# Patient Record
Sex: Male | Born: 1990 | Race: White | Hispanic: No | Marital: Single | Smoking: Current every day smoker
Health system: Southern US, Community
[De-identification: ages and names within clinical notes are randomized; demographics above are authoritative.]

## PROBLEM LIST (undated history)

## (undated) DIAGNOSIS — F32A Depression, unspecified: Secondary | ICD-10-CM

## (undated) DIAGNOSIS — F329 Major depressive disorder, single episode, unspecified: Secondary | ICD-10-CM

## (undated) HISTORY — PX: WISDOM TOOTH EXTRACTION: SHX21

---

## 2012-12-01 ENCOUNTER — Encounter (HOSPITAL_COMMUNITY): Payer: Self-pay | Admitting: *Deleted

## 2012-12-01 ENCOUNTER — Emergency Department (HOSPITAL_COMMUNITY)
Admission: EM | Admit: 2012-12-01 | Discharge: 2012-12-02 | Disposition: A | Discharge status: Home / Self Care | Payer: Self-pay | Attending: Emergency Medicine | Admitting: Emergency Medicine

## 2012-12-01 DIAGNOSIS — R45851 Suicidal ideations: Secondary | ICD-10-CM | POA: Insufficient documentation

## 2012-12-01 DIAGNOSIS — F1210 Cannabis abuse, uncomplicated: Secondary | ICD-10-CM | POA: Insufficient documentation

## 2012-12-01 DIAGNOSIS — F329 Major depressive disorder, single episode, unspecified: Secondary | ICD-10-CM | POA: Insufficient documentation

## 2012-12-01 HISTORY — DX: Depression, unspecified: F32.A

## 2012-12-01 HISTORY — DX: Major depressive disorder, single episode, unspecified: F32.9

## 2012-12-01 LAB — COMPREHENSIVE METABOLIC PANEL
ALT: 16 U/L (ref 0–53)
AST: 23 U/L (ref 0–37)
Alkaline Phosphatase: 48 U/L (ref 39–117)
CO2: 25 mEq/L (ref 19–32)
Chloride: 103 mEq/L (ref 96–112)
Creatinine, Ser: 0.71 mg/dL (ref 0.50–1.35)
GFR calc non Af Amer: >90 mL/min (ref >90)
Potassium: 3.5 mEq/L (ref 3.5–5.1)
Sodium: 139 mEq/L (ref 135–145)
Total Bilirubin: 0.8 mg/dL (ref 0.3–1.2)

## 2012-12-01 LAB — CBC
MCV: 86.7 fL (ref 78.0–100.0)
Platelets: 213 10*3/uL (ref 150–400)
RBC: 5.02 MIL/uL (ref 4.22–5.81)
WBC: 7.6 10*3/uL (ref 4.0–10.5)

## 2012-12-01 LAB — ACETAMINOPHEN LEVEL: Acetaminophen (Tylenol), Serum: <15 ug/mL (ref 10–30)

## 2012-12-01 LAB — RAPID URINE DRUG SCREEN, HOSP PERFORMED
Amphetamines: POSITIVE — AB
Barbiturates: NOT DETECTED
Benzodiazepines: NOT DETECTED
Tetrahydrocannabinol: POSITIVE — AB

## 2012-12-01 NOTE — ED Notes (Signed)
Pt states that he is at his end,  He has thought about jumping off a bridge or walking in front of train,  Says he apologized to his Mom today for about what he is going to do.  Pt has flat affect and doesn't look at this Clinical research associate while interviewing

## 2012-12-02 ENCOUNTER — Encounter (HOSPITAL_COMMUNITY): Payer: Self-pay | Admitting: *Deleted

## 2012-12-02 MED ORDER — IBUPROFEN 600 MG PO TABS: 600.0000 mg | ORAL_TABLET | Freq: Three times a day (TID) | ORAL | Status: DC | PRN

## 2012-12-02 MED ORDER — NICOTINE 21 MG/24HR TD PT24: 21.0000 mg | MEDICATED_PATCH | Freq: Every day | TRANSDERMAL | Status: DC

## 2012-12-02 MED ORDER — ONDANSETRON HCL 4 MG PO TABS: 4.0000 mg | ORAL_TABLET | Freq: Three times a day (TID) | ORAL | Status: DC | PRN

## 2012-12-02 MED ORDER — ACETAMINOPHEN 325 MG PO TABS: 650.0000 mg | ORAL_TABLET | ORAL | Status: DC | PRN

## 2012-12-02 MED ORDER — LORAZEPAM 1 MG PO TABS: 1.0000 mg | ORAL_TABLET | Freq: Three times a day (TID) | ORAL | Status: DC | PRN

## 2012-12-02 NOTE — ED Provider Notes (Signed)
History     CSN: [id number]  Arrival date & time 12/01/12  2241   First MD Initiated Contact with Patient 12/02/12 0134      Chief Complaint  Patient presents with  . Medical Clearance    (Consider location/radiation/quality/duration/timing/severity/associated sxs/prior treatment) HPI Comments: Patient states he has been depressed recently over living situation disagreements with girlfriend and his work.  Tonight, expressed suicidal ideation by jumping off which he, states he has no intention of doing his never had any psychiatric evaluation or issues before.  Denies any alcohol abuse.  Does, state that he smokes marijuana on a regular basis.  Denies any medical complaints at this time  The history is provided by the patient.    Past Medical History  Diagnosis Date  . Depression     No past surgical history on file.  History reviewed. No pertinent family history.  History  Substance Use Topics  . Smoking status: Never Smoker   . Smokeless tobacco: Not on file  . Alcohol Use: Yes     Comment: occasional use       Review of Systems  Psychiatric/Behavioral: Positive for suicidal ideas.  All other systems reviewed and are negative.    Allergies  Review of patient's allergies indicates no known allergies.  Home Medications  No current outpatient prescriptions on file.  BP 116/73  Pulse 53  Temp(Src) 97.8 F (36.6 C) (Oral)  Resp 18  SpO2 99%  Physical Exam  Nursing note and vitals reviewed. Constitutional: He is oriented to person, place, and time. He appears well-developed and well-nourished.  HENT:  Head: Normocephalic.  Eyes: Pupils are equal, round, and reactive to light.  Neck: Normal range of motion.  Cardiovascular: Normal rate.   Pulmonary/Chest: Effort normal.  Neurological: He is alert and oriented to person, place, and time.  Skin: Skin is warm and dry.  Psychiatric: His speech is normal and behavior is normal. Judgment and thought content  normal. Cognition and memory are normal. He exhibits a depressed mood.    ED Course  Procedures (including critical care time)  Labs Reviewed  SALICYLATE LEVEL - Abnormal; Notable for the following:    Salicylate Lvl <2.0 (*)    All other components within normal limits  URINE RAPID DRUG SCREEN (HOSP PERFORMED) - Abnormal; Notable for the following:    Amphetamines POSITIVE (*)    Tetrahydrocannabinol POSITIVE (*)    All other components within normal limits  ACETAMINOPHEN LEVEL  CBC  COMPREHENSIVE METABOLIC PANEL  ETHANOL   No results found.   1. Major depressive episode       MDM   DR. Penalver , formed, tell psych consult, and recommends discharge home with mental health followup Patient contracted for safety        Arman Filter, NP 12/02/12 4098  Arman Filter, NP 12/02/12 [phone]

## 2012-12-02 NOTE — BH Assessment (Signed)
Assessment Note   Darius Wilson is a 22 y.o. male who presents to [facility] with SI/Depression.  Pt is Si w/plan to jump off bridge or walk in front of train.  Pt denies HI/Psych.  Pt reports stressors: (1) new job, (2) moving away from parents home, (3) musician(trouble with making a living), (4) relational issues with parents and girlfriend.  Pt says he feels stagnate, not moving forward in life.  Pt admits to SI thoughts in the past, no attempts to harm.  Pt has been SI x2 days. Pt says he's overwhelmed and trouble coping and states that he feels better since being int he emerg dept and contract for safety.  Pt says he smoke 1/2 gram of thc daily, last use 12/01/12, uses alcohol occasionally.  Pt completed telepsych, recommend d/c.  Dr. Fonnie Jarvis aware of disposition, will d/c pt home.   Axis I: Depressive D/O; Cannabis Abuse  Axis II: Deferred Axis III:  Past Medical History  Diagnosis Date  . Depression    Axis IV: other psychosocial or environmental problems, problems related to social environment and problems with primary support group Axis V: 41-50 serious symptoms  Past Medical History:  Past Medical History  Diagnosis Date  . Depression     No past surgical history on file.  Family History: History reviewed. No pertinent family history.  Social History:  reports that he has never smoked. He does not have any smokeless tobacco history on file. He reports that  drinks alcohol. He reports that he uses illicit drugs (Marijuana).  Additional Social History:  Alcohol / Drug Use Pain Medications: None  Prescriptions: None  Over the Counter: None  History of alcohol / drug use?: Yes Longest period of sobriety (when/how long): None  Negative Consequences of Use: Personal relationships Withdrawal Symptoms: Other (Comment) (No current w/d sxs ) Substance #1 Name of Substance 1: THC 1 - Age of First Use: Teens  1 - Amount (size/oz): 1/2 Gram  1 - Frequency: Daily  1 - Duration:  On-going  1 - Last Use / Amount: 12/01/12  CIWA: CIWA-Ar BP: 125/82 mmHg Pulse Rate: 66 COWS:    Allergies: No Known Allergies  Home Medications:  (Not in a hospital admission)  OB/GYN Status:  No LMP for male patient.  General Assessment Data Location of Assessment: [facility] Living Arrangements: Parent (Lives with mother ) Can pt return to current living arrangement?: Yes Admission Status: Voluntary Is patient capable of signing voluntary admission?: Yes Transfer from: Acute Hospital Referral Source: MD  Education Status Is patient currently in school?: No Current Grade: None  Highest grade of school patient has completed: None  Name of school: None  Contact person: None   Risk to self Suicidal Ideation: No-Not Currently/Within Last 6 Months Suicidal Intent: No-Not Currently/Within Last 6 Months Is patient at risk for suicide?: No Suicidal Plan?: No-Not Currently/Within Last 6 Months Access to Means: No What has been your use of drugs/alcohol within the last 12 months?: Abusing THC--1/2 gram daily  Previous Attempts/Gestures: No How many times?: 0 Other Self Harm Risks: None  Triggers for Past Attempts: Unpredictable Intentional Self Injurious Behavior: None Family Suicide History: No Recent stressful life event(s): Conflict (Comment) (Issues with girlfriend) Persecutory voices/beliefs?: No Depression: Yes Depression Symptoms: Loss of interest in usual pleasures Substance abuse history and/or treatment for substance abuse?: Yes Suicide prevention information given to non-admitted patients: Not applicable  Risk to Others Homicidal Ideation: No Thoughts of Harm to Others: No Current Homicidal Intent:  No Current Homicidal Plan: No Access to Homicidal Means: No Identified Victim: None  History of harm to others?: No Assessment of Violence: None Noted Violent Behavior Description: None  Does patient have access to weapons?: No Criminal Charges Pending?: No Does  patient have a court date: No  Psychosis Hallucinations: None noted Delusions: None noted  Mental Status Report Appear/Hygiene: Disheveled;Poor hygiene Eye Contact: Good Motor Activity: Unremarkable Speech: Logical/coherent Level of Consciousness: Alert Mood: Ashamed/humiliated;Sad Affect: Appropriate to circumstance;Sad Anxiety Level: None Thought Processes: Coherent;Relevant Judgement: Impaired Orientation: Person;Place;Time;Situation Obsessive Compulsive Thoughts/Behaviors: None  Cognitive Functioning Concentration: Normal Memory: Recent Intact;Remote Intact IQ: Average Insight: Fair Impulse Control: Fair Appetite: Good Weight Loss: 0 Weight Gain: 0 Sleep: No Change Total Hours of Sleep: 8 Vegetative Symptoms: None  ADLScreening [facility] Assessment Services) Patient's cognitive ability adequate to safely complete daily activities?: Yes Patient able to express need for assistance with ADLs?: Yes Independently performs ADLs?: Yes (appropriate for developmental age)  Abuse/Neglect [facility]) Physical Abuse: Denies Verbal Abuse: Denies Sexual Abuse: Denies  Prior Inpatient Therapy Prior Inpatient Therapy: No Prior Therapy Dates: None  Prior Therapy Facilty/Provider(s): None  Reason for Treatment: Noen   Prior Outpatient Therapy Prior Outpatient Therapy: No Prior Therapy Dates: None  Prior Therapy Facilty/Provider(s): None  Reason for Treatment: None   ADL Screening (condition at time of admission) Patient's cognitive ability adequate to safely complete daily activities?: Yes Patient able to express need for assistance with ADLs?: Yes Independently performs ADLs?: Yes (appropriate for developmental age) Weakness of Legs: None Weakness of Arms/Hands: None  Home Assistive Devices/Equipment Home Assistive Devices/Equipment: None  Therapy Consults (therapy consults require a physician order) PT Evaluation Needed: No OT Evalulation Needed: No SLP Evaluation Needed:  No Abuse/Neglect Assessment (Assessment to be complete while patient is alone) Physical Abuse: Denies Verbal Abuse: Denies Sexual Abuse: Denies Exploitation of patient/patient's resources: Denies Self-Neglect: Denies Values / Beliefs Cultural Requests During Hospitalization: None Spiritual Requests During Hospitalization: None Consults Spiritual Care Consult Needed: No Social Work Consult Needed: No Merchant navy officer (For Healthcare) Advance Directive: Patient does not have advance directive;Patient would not like information Pre-existing out of facility DNR order (yellow form or pink MOST form): No Nutrition Screen- [facility] Patient's home diet: Regular Have you recently lost weight without trying?: No Have you been eating poorly because of a decreased appetite?: No Malnutrition Screening Tool Score: 0  Additional Information 1:1 In Past 12 Months?: No CIRT Risk: No Elopement Risk: No Does patient have medical clearance?: Yes     Disposition:  Disposition Initial Assessment Completed for this Encounter: Yes Disposition of Patient: Referred to (Telepsych ) Patient referred to: Other (Comment) (Telepsych )  On Site Evaluation by:   Reviewed with Physician:     Murrell Redden 12/02/2012 5:41 AM

## 2012-12-02 NOTE — ED Notes (Signed)
Patient arrived to unit. No s/s of distress noted. Pt pleasant and verbally contracts for safety.

## 2012-12-08 NOTE — ED Provider Notes (Signed)
Medical screening examination/treatment/procedure(s) were performed by non-physician practitioner and as supervising physician I was immediately available for consultation/collaboration.  Hurman Horn, MD 12/08/12 [phone]

## 2014-02-08 ENCOUNTER — Inpatient Hospital Stay (HOSPITAL_COMMUNITY): Payer: Medicaid Other

## 2014-02-08 ENCOUNTER — Inpatient Hospital Stay (HOSPITAL_COMMUNITY)
Admission: EM | Admit: 2014-02-08 | Discharge: 2014-02-11 | DRG: 918 | Disposition: A | Discharge status: Other Acute Inpatient Hospital | Payer: Self-pay | Attending: Internal Medicine | Admitting: Internal Medicine

## 2014-02-08 ENCOUNTER — Encounter (HOSPITAL_COMMUNITY): Payer: Self-pay | Admitting: Emergency Medicine

## 2014-02-08 DIAGNOSIS — F32A Depression, unspecified: Secondary | ICD-10-CM | POA: Diagnosis present

## 2014-02-08 DIAGNOSIS — F329 Major depressive disorder, single episode, unspecified: Secondary | ICD-10-CM

## 2014-02-08 DIAGNOSIS — T50904A Poisoning by unspecified drugs, medicaments and biological substances, undetermined, initial encounter: Secondary | ICD-10-CM

## 2014-02-08 DIAGNOSIS — T50901A Poisoning by unspecified drugs, medicaments and biological substances, accidental (unintentional), initial encounter: Principal | ICD-10-CM | POA: Diagnosis present

## 2014-02-08 DIAGNOSIS — T6591XA Toxic effect of unspecified substance, accidental (unintentional), initial encounter: Secondary | ICD-10-CM | POA: Diagnosis present

## 2014-02-08 DIAGNOSIS — F322 Major depressive disorder, single episode, severe without psychotic features: Secondary | ICD-10-CM

## 2014-02-08 DIAGNOSIS — E876 Hypokalemia: Secondary | ICD-10-CM | POA: Diagnosis present

## 2014-02-08 DIAGNOSIS — F19921 Other psychoactive substance use, unspecified with intoxication with delirium: Secondary | ICD-10-CM | POA: Diagnosis present

## 2014-02-08 DIAGNOSIS — F411 Generalized anxiety disorder: Secondary | ICD-10-CM | POA: Diagnosis present

## 2014-02-08 DIAGNOSIS — F332 Major depressive disorder, recurrent severe without psychotic features: Secondary | ICD-10-CM | POA: Diagnosis present

## 2014-02-08 DIAGNOSIS — T50902A Poisoning by unspecified drugs, medicaments and biological substances, intentional self-harm, initial encounter: Secondary | ICD-10-CM | POA: Diagnosis present

## 2014-02-08 DIAGNOSIS — Z87891 Personal history of nicotine dependence: Secondary | ICD-10-CM

## 2014-02-08 DIAGNOSIS — F05 Delirium due to known physiological condition: Secondary | ICD-10-CM

## 2014-02-08 DIAGNOSIS — R9431 Abnormal electrocardiogram [ECG] [EKG]: Secondary | ICD-10-CM | POA: Diagnosis present

## 2014-02-08 DIAGNOSIS — R45851 Suicidal ideations: Secondary | ICD-10-CM

## 2014-02-08 DIAGNOSIS — F1210 Cannabis abuse, uncomplicated: Secondary | ICD-10-CM | POA: Diagnosis present

## 2014-02-08 DIAGNOSIS — F3289 Other specified depressive episodes: Secondary | ICD-10-CM

## 2014-02-08 DIAGNOSIS — IMO0002 Reserved for concepts with insufficient information to code with codable children: Secondary | ICD-10-CM

## 2014-02-08 LAB — CBC WITH DIFFERENTIAL/PLATELET
BASOS PCT: 0 % (ref 0–1)
Basophils Absolute: 0 10*3/uL (ref 0.0–0.1)
EOS ABS: 0 10*3/uL (ref 0.0–0.7)
EOS PCT: 0 % (ref 0–5)
HEMATOCRIT: 43.7 % (ref 39.0–52.0)
HEMOGLOBIN: 15.4 g/dL (ref 13.0–17.0)
Lymphocytes Relative: 13 % (ref 12–46)
Lymphs Abs: 1.4 10*3/uL (ref 0.7–4.0)
MCH: 31 pg (ref 26.0–34.0)
MCHC: 35.2 g/dL (ref 30.0–36.0)
MCV: 87.9 fL (ref 78.0–100.0)
MONO ABS: 0.6 10*3/uL (ref 0.1–1.0)
MONOS PCT: 6 % (ref 3–12)
NEUTROS ABS: 8.5 10*3/uL — AB (ref 1.7–7.7)
Neutrophils Relative %: 81 % — ABNORMAL HIGH (ref 43–77)
Platelets: 262 10*3/uL (ref 150–400)
RBC: 4.97 MIL/uL (ref 4.22–5.81)
RDW: 12.7 % (ref 11.5–15.5)
WBC: 10.5 10*3/uL (ref 4.0–10.5)

## 2014-02-08 LAB — COMPREHENSIVE METABOLIC PANEL
ALT: 10 U/L (ref 0–53)
AST: 19 U/L (ref 0–37)
Albumin: 4.7 g/dL (ref 3.5–5.2)
Alkaline Phosphatase: 60 U/L (ref 39–117)
BILIRUBIN TOTAL: 0.5 mg/dL (ref 0.3–1.2)
BUN: 10 mg/dL (ref 6–23)
CHLORIDE: 99 meq/L (ref 96–112)
CO2: 22 meq/L (ref 19–32)
CREATININE: 0.93 mg/dL (ref 0.50–1.35)
Calcium: 9.7 mg/dL (ref 8.4–10.5)
GFR calc Af Amer: >90 mL/min (ref >90)
Glucose, Bld: 141 mg/dL — ABNORMAL HIGH (ref 70–99)
Potassium: 3.2 mEq/L — ABNORMAL LOW (ref 3.7–5.3)
Sodium: 140 mEq/L (ref 137–147)
Total Protein: 8.4 g/dL — ABNORMAL HIGH (ref 6.0–8.3)

## 2014-02-08 LAB — RAPID URINE DRUG SCREEN, HOSP PERFORMED
AMPHETAMINES: POSITIVE — AB
Barbiturates: NOT DETECTED
Benzodiazepines: NOT DETECTED
Cocaine: NOT DETECTED
Opiates: NOT DETECTED
TETRAHYDROCANNABINOL: POSITIVE — AB

## 2014-02-08 LAB — SALICYLATE LEVEL: Salicylate Lvl: <2 mg/dL — ABNORMAL LOW (ref 2.8–20.0)

## 2014-02-08 LAB — I-STAT CG4 LACTIC ACID, ED: LACTIC ACID, VENOUS: 2.34 mmol/L — AB (ref 0.5–2.2)

## 2014-02-08 LAB — ACETAMINOPHEN LEVEL

## 2014-02-08 LAB — ETHANOL

## 2014-02-08 LAB — MAGNESIUM: Magnesium: 2 mg/dL (ref 1.5–2.5)

## 2014-02-08 LAB — CK: Total CK: 194 U/L (ref 7–232)

## 2014-02-08 LAB — I-STAT TROPONIN, ED: Troponin i, poc: 0 ng/mL (ref 0.00–0.08)

## 2014-02-08 LAB — MRSA PCR SCREENING: MRSA BY PCR: NEGATIVE

## 2014-02-08 MED ORDER — QUETIAPINE FUMARATE 50 MG PO TABS
50.0000 mg | ORAL_TABLET | Freq: Two times a day (BID) | ORAL | Status: DC | PRN
  Filled 2014-02-08: qty 1

## 2014-02-08 MED ORDER — ONDANSETRON HCL 4 MG/2ML IJ SOLN: 4.0000 mg | Freq: Four times a day (QID) | INTRAMUSCULAR | Status: DC | PRN

## 2014-02-08 MED ORDER — LORAZEPAM 2 MG/ML IJ SOLN
1.0000 mg | Freq: Once | INTRAMUSCULAR | Status: AC
  Administered 2014-02-08: 1 mg via INTRAVENOUS
  Filled 2014-02-08: qty 1

## 2014-02-08 MED ORDER — ENOXAPARIN SODIUM 40 MG/0.4ML ~~LOC~~ SOLN
40.0000 mg | SUBCUTANEOUS | Status: DC
  Administered 2014-02-08 – 2014-02-10 (×3): 40 mg via SUBCUTANEOUS
  Filled 2014-02-08 (×5): qty 0.4

## 2014-02-08 MED ORDER — SODIUM CHLORIDE 0.9 % IJ SOLN
3.0000 mL | Freq: Two times a day (BID) | INTRAMUSCULAR | Status: DC
  Administered 2014-02-09 – 2014-02-10 (×4): 3 mL via INTRAVENOUS

## 2014-02-08 MED ORDER — ONDANSETRON HCL 4 MG PO TABS: 4.0000 mg | ORAL_TABLET | Freq: Four times a day (QID) | ORAL | Status: DC | PRN

## 2014-02-08 MED ORDER — SODIUM CHLORIDE 0.9 % IV SOLN
INTRAVENOUS | Status: DC
  Administered 2014-02-08 – 2014-02-09 (×3): via INTRAVENOUS
  Filled 2014-02-08 (×6): qty 1000

## 2014-02-08 MED ORDER — ACETAMINOPHEN 325 MG PO TABS: 650.0000 mg | ORAL_TABLET | Freq: Four times a day (QID) | ORAL | Status: DC | PRN

## 2014-02-08 MED ORDER — SODIUM CHLORIDE 0.9 % IV BOLUS (SEPSIS)
1000.0000 mL | Freq: Once | INTRAVENOUS | Status: AC
  Administered 2014-02-08: 1000 mL via INTRAVENOUS

## 2014-02-08 MED ORDER — LORAZEPAM 2 MG/ML IJ SOLN: 2.0000 mg | INTRAMUSCULAR | Status: DC | PRN

## 2014-02-08 MED ORDER — ACETAMINOPHEN 650 MG RE SUPP: 650.0000 mg | Freq: Four times a day (QID) | RECTAL | Status: DC | PRN

## 2014-02-08 NOTE — ED Notes (Signed)
CG4 Lac Acid given to Dr. Norlene Campbelltter.

## 2014-02-08 NOTE — ED Provider Notes (Signed)
Medical screening examination/treatment/procedure(s) were conducted as a shared visit with non-physician practitioner(s) and myself.  I personally evaluated the patient during the encounter.   EKG Interpretation   Date/Time:  Monday February 08 2014 03:29:03 EDT Ventricular Rate:  128 PR Interval:  124 QRS Duration: 102 QT Interval:  444 QTC Calculation: 648 R Axis:   101 Text Interpretation:  Sinus tachycardia Borderline right axis deviation ST  elev, probable normal early repol pattern Prolonged QT interval No old  tracing to compare Confirmed by Tsuruko Murtha  MD, Clayten Allcock ([id number]) on 02/08/2014  3:32:27 AM     Pt with overdose, suspect anticholinergic syndrome.  To be admitted.  Olivia Mackielga M Jamine Highfill, MD 02/08/14 [phone]

## 2014-02-08 NOTE — ED Notes (Signed)
Pt was last seen acting normal 6/14 midday.  Pt presents w/ diaphoresis, tachycardia, dilated pupils, tremors and slurred speech.  Pt denies taking any type of drugs.

## 2014-02-08 NOTE — Progress Notes (Signed)
Clinical Social Work  MD and RN requesting IVC due to patient trying to leave. CSW faxed IVC paperwork to Magistrate who confirms IVC forms were received and they will serve patient. CSW put original paperwork in chart and explained to RN that IVC forms were valid for 24 hours since MD had signed and once [employer] serves then paperwork is valid for 7 days.  GodfreyHolly Ahren Wilson, [place] [phone]

## 2014-02-08 NOTE — ED Notes (Signed)
Patient friend arrived with sleeping pill bottle patient is suspected of taking. Friend states this was a new bottle of 60 softgel [employer] (Maximum Strength Nighttime Sleep Aid) with 50mg  diphenhydramine per softgel. Updated poison control, no new recommendations at this time. Admitting MD aware of medication, dosage, and quantity suspected of ingestion.

## 2014-02-08 NOTE — ED Notes (Signed)
Per Poison Control recommendations consider other possible drugs on board. Based on patient activity and vital signs it is believed patient likely took sleep aids as suspected @1  hour to slightly greater than one hour. Recommend use of benzodiazepines to treat symptoms, and patient will likely need admission. Monitor for hyperthermia and seizure activity.

## 2014-02-08 NOTE — Progress Notes (Signed)
Patient experiencing hallucinations - speaking to people not in the room. Jumped out of bed and screamed for his mom. Patient placed back in bed and is now sobbing.

## 2014-02-08 NOTE — Consult Note (Signed)
[place] [facility] Consult   Reason for Consult:  Drug overdose (Benadryl and amphitamine) and cannabis abuse Referring Physician:  Dr. Denzil Magnuson Darius Wilson is an 23 y.o. male. Total Time spent with patient: 45 minutes  Assessment: AXIS I: Delirium secondary to drug overdose; Generalized Anxiety Disorder, Major Depression, Recurrent severe and Cannabis abuse AXIS II:  Deferred AXIS III:   Past Medical History  Diagnosis Date  . Depression    AXIS IV:  other psychosocial or environmental problems, problems related to social environment and problems with primary support group AXIS V:  21-30 behavior considerably influenced by delusions or hallucinations OR serious impairment in judgment, communication OR inability to function in almost all areas  Plan:  Patient will be followed by psychiatric consultation while he is [facility] Recommend Seroquel 50 mg BID/PRN for agitation and aggression  Recommend psychiatric Inpatient admission when medically cleared. Supportive therapy provided about ongoing stressors. Discussed crisis plan, support from social network, calling 911, coming to the Emergency Department, and calling Suicide Hotline. Appreciate psychiatric consultation and follow up tomorrow Please contact [phone] if needs further assistance  Subjective:   Darius Wilson is a 23 y.o. male patient admitted with Drug overdose.  HPI:  Patient is seen and chart reviewed, case discussed staff and staff RN and page LCSW. Patient has been restless, anxious, not coherent, disorganized and poor historian at this time. Patient seems to be delirious and trying to walk out of bed and trying to remove his BP monitor and needs repeated redirections. Reportedly he has overdose adderall unknown amount and found benadryl bottle which was open. This is a 23 year old male with past medical hx of depression and suicidal ideations was brought in by his roommate the patient was incoherent and  flushed on arrival. Patient has no family members at bedside. Patient had a missed his brother's graduation and had an argument with his mother yesterday. He also was under a lot of emotional stress after breaking up with his girlfriend a few months back. Patient's friend found a bottle of wel-sum ( benadryl) which was opened at the bedside. On counting the pills in the ED patient likely had taken 32 tablets oral 50 mg Benadryl. Patient UDS is positive for amphitamines and cannabis and BAL is less than 11.   Medical history: difficult to oral pain from the patient as he is restless and incoherent with pressured speech. headache, dizziness, fever, chills, nausea , vomiting, chest pain, palpitations, SOB, abdominal pain, bowel or urinary symptoms. He denies taking any medications or overdosing himself . Denies suicidal ideation. Course in the ED Patient was tachycardic and diaphoretic with dilated pupils. His speech was pressured and incoherent. He was afebrile. Blood we'll go in for WBC of 10.5, normal hemoglobin and platelets. Chemistry showed sodium of 140, potassium of 2.2, chloride of 99, CO2 of 22 with anion gap of 19. Renal function was normal. The glucose was 141. Lactic acid was elevated to 2.4. EKG showed normal sinus rhythm with QTC of 484. On arrival the QTC on the monitor was prolonged to greater than 600. Patient given 2 L IV normal saline bolus and 1 mg IV Ativan X2.    Review of Systems:  As outlined in history of present illness  HPI Elements:   Location:  depression and suicidal attempt. Quality:  poor. Severity:  acute. Timing:  unknown stress.  Past Psychiatric History: Past Medical History  Diagnosis Date  . Depression     reports that he has quit  smoking. He does not have any smokeless tobacco history on file. He reports that he drinks alcohol. He reports that he uses illicit drugs (Marijuana). History reviewed. No pertinent family history.      Allergies:  No Known  Allergies  ACT Assessment Complete:  NO Objective: Blood pressure 128/72, pulse 84, temperature 97 F (36.1 C), temperature source Axillary, resp. rate 15, height 5' 10"  (1.778 m), weight 69.1 kg (152 lb 5.4 oz), SpO2 97.00%.Body mass index is 21.86 kg/(m^2). Results for orders placed during the hospital encounter of 02/08/14 (from the past 72 hour(s))  CBC WITH DIFFERENTIAL     Status: Abnormal   Collection Time    02/08/14  3:45 AM      Result Value Ref Range   WBC 10.5  4.0 - 10.5 K/uL   RBC 4.97  4.22 - 5.81 MIL/uL   Hemoglobin 15.4  13.0 - 17.0 g/dL   HCT 43.7  39.0 - 52.0 %   MCV 87.9  78.0 - 100.0 fL   MCH 31.0  26.0 - 34.0 pg   MCHC 35.2  30.0 - 36.0 g/dL   RDW 12.7  11.5 - 15.5 %   Platelets 262  150 - 400 K/uL   Neutrophils Relative % 81 (*) 43 - 77 %   Neutro Abs 8.5 (*) 1.7 - 7.7 K/uL   Lymphocytes Relative 13  12 - 46 %   Lymphs Abs 1.4  0.7 - 4.0 K/uL   Monocytes Relative 6  3 - 12 %   Monocytes Absolute 0.6  0.1 - 1.0 K/uL   Eosinophils Relative 0  0 - 5 %   Eosinophils Absolute 0.0  0.0 - 0.7 K/uL   Basophils Relative 0  0 - 1 %   Basophils Absolute 0.0  0.0 - 0.1 K/uL  COMPREHENSIVE METABOLIC PANEL     Status: Abnormal   Collection Time    02/08/14  3:45 AM      Result Value Ref Range   Sodium 140  137 - 147 mEq/L   Potassium 3.2 (*) 3.7 - 5.3 mEq/L   Chloride 99  96 - 112 mEq/L   CO2 22  19 - 32 mEq/L   Glucose, Bld 141 (*) 70 - 99 mg/dL   BUN 10  6 - 23 mg/dL   Creatinine, Ser 0.93  0.50 - 1.35 mg/dL   Calcium 9.7  8.4 - 10.5 mg/dL   Total Protein 8.4 (*) 6.0 - 8.3 g/dL   Albumin 4.7  3.5 - 5.2 g/dL   AST 19  0 - 37 U/L   ALT 10  0 - 53 U/L   Alkaline Phosphatase 60  39 - 117 U/L   Total Bilirubin 0.5  0.3 - 1.2 mg/dL   GFR calc non Af Amer >90  >90 mL/min   GFR calc Af Amer >90  >90 mL/min   Comment: (NOTE)     The eGFR has been calculated using the CKD EPI equation.     This calculation has not been validated in all clinical situations.      eGFR's persistently <90 mL/min signify possible Chronic Kidney     Disease.  ETHANOL     Status: None   Collection Time    02/08/14  3:45 AM      Result Value Ref Range   Alcohol, Ethyl (B) <11  0 - 11 mg/dL   Comment:            LOWEST DETECTABLE LIMIT FOR  SERUM ALCOHOL IS 11 mg/dL     FOR MEDICAL PURPOSES ONLY  ACETAMINOPHEN LEVEL     Status: None   Collection Time    02/08/14  3:45 AM      Result Value Ref Range   Acetaminophen (Tylenol), Serum <15.0  10 - 30 ug/mL   Comment:            THERAPEUTIC CONCENTRATIONS VARY     SIGNIFICANTLY. A RANGE OF 10-30     ug/mL MAY BE AN EFFECTIVE     CONCENTRATION FOR MANY PATIENTS.     HOWEVER, SOME ARE BEST TREATED     AT CONCENTRATIONS OUTSIDE THIS     RANGE.     ACETAMINOPHEN CONCENTRATIONS     >150 ug/mL AT 4 HOURS AFTER     INGESTION AND >50 ug/mL AT 12     HOURS AFTER INGESTION ARE     OFTEN ASSOCIATED WITH TOXIC     REACTIONS.  SALICYLATE LEVEL     Status: Abnormal   Collection Time    02/08/14  3:45 AM      Result Value Ref Range   Salicylate Lvl <2.5 (*) 2.8 - 20.0 mg/dL  MAGNESIUM     Status: None   Collection Time    02/08/14  3:45 AM      Result Value Ref Range   Magnesium 2.0  1.5 - 2.5 mg/dL  CK     Status: None   Collection Time    02/08/14  3:45 AM      Result Value Ref Range   Total CK 194  7 - 232 U/L  I-STAT TROPOININ, ED     Status: None   Collection Time    02/08/14  3:56 AM      Result Value Ref Range   Troponin i, poc 0.00  0.00 - 0.08 ng/mL   Comment 3            Comment: Due to the release kinetics of cTnI,     a negative result within the first hours     of the onset of symptoms does not rule out     myocardial infarction with certainty.     If myocardial infarction is still suspected,     repeat the test at appropriate intervals.  I-STAT CG4 LACTIC ACID, ED     Status: Abnormal   Collection Time    02/08/14  3:57 AM      Result Value Ref Range   Lactic Acid, Venous 2.34 (*) 0.5 - 2.2  mmol/L  URINE RAPID DRUG SCREEN (HOSP PERFORMED)     Status: Abnormal   Collection Time    02/08/14  5:11 AM      Result Value Ref Range   Opiates NONE DETECTED  NONE DETECTED   Cocaine NONE DETECTED  NONE DETECTED   Benzodiazepines NONE DETECTED  NONE DETECTED   Amphetamines POSITIVE (*) NONE DETECTED   Tetrahydrocannabinol POSITIVE (*) NONE DETECTED   Barbiturates NONE DETECTED  NONE DETECTED   Comment:            DRUG SCREEN FOR MEDICAL PURPOSES     ONLY.  IF CONFIRMATION IS NEEDED     FOR ANY PURPOSE, NOTIFY LAB     WITHIN 5 DAYS.                LOWEST DETECTABLE LIMITS     FOR URINE DRUG SCREEN     Drug Class  Cutoff (ng/mL)     Amphetamine      1000     Barbiturate      200     Benzodiazepine   144     Tricyclics       818     Opiates          300     Cocaine          300     THC              50  MRSA PCR SCREENING     Status: None   Collection Time    02/08/14  7:04 AM      Result Value Ref Range   MRSA by PCR NEGATIVE  NEGATIVE   Comment:            The GeneXpert MRSA Assay (FDA     approved for NASAL specimens     only), is one component of a     comprehensive MRSA colonization     surveillance program. It is not     intended to diagnose MRSA     infection nor to guide or     monitor treatment for     MRSA infections.   Labs are reviewed and are pertinent for amphetamine and THC.  Current Facility-Administered Medications  Medication Dose Route Frequency Provider Last Rate Last Dose  . acetaminophen (TYLENOL) tablet 650 mg  650 mg Oral Q6H PRN Nishant Dhungel, MD       Or  . acetaminophen (TYLENOL) suppository 650 mg  650 mg Rectal Q6H PRN Nishant Dhungel, MD      . enoxaparin (LOVENOX) injection 40 mg  40 mg Subcutaneous Q24H Nishant Dhungel, MD   40 mg at 02/08/14 0801  . LORazepam (ATIVAN) injection 2 mg  2 mg Intravenous Q4H PRN Nishant Dhungel, MD      . ondansetron (ZOFRAN) tablet 4 mg  4 mg Oral Q6H PRN Nishant Dhungel, MD       Or  .  ondansetron (ZOFRAN) injection 4 mg  4 mg Intravenous Q6H PRN Nishant Dhungel, MD      . sodium chloride 0.9 % 1,000 mL with potassium chloride 40 mEq infusion   Intravenous Continuous Nishant Dhungel, MD 100 mL/hr at 02/08/14 0800    . sodium chloride 0.9 % injection 3 mL  3 mL Intravenous Q12H Nishant Dhungel, MD        Psychiatric Specialty Exam: Physical Exam  ROS  Blood pressure 128/72, pulse 84, temperature 97 F (36.1 C), temperature source Axillary, resp. rate 15, height 5' 10"  (1.778 m), weight 69.1 kg (152 lb 5.4 oz), SpO2 97.00%.Body mass index is 21.86 kg/(m^2).  General Appearance: Disheveled and Guarded  Eye Sport and exercise psychologist::  Fair  Speech:  Blocked, Garbled and Slurred  Volume:  Decreased  Mood:  Anxious, Depressed and Hopeless  Affect:  Non-Congruent and Inappropriate  Thought Process:  Disorganized, Loose and Tangential  Orientation:  Negative  Thought Content:  Rumination  Suicidal Thoughts:  Yes.  with intent/plan  Homicidal Thoughts:  No  Memory:  Remote;   Poor  Judgement:  Poor  Insight:  Shallow  Psychomotor Activity:  Restlessness  Concentration:  Poor  Recall:  Poor  Fund of Knowledge:NA  Language: Fair  Akathisia:  NA  Handed:  Right  AIMS (if indicated):     Assets:  Communication Skills Desire for Improvement Financial Resources/Insurance Resilience Social Support Talents/Skills  Sleep:  Musculoskeletal: Strength & Muscle Tone: increased Gait & Station: unable to stand Patient leans: N/A  Treatment Plan Summary: Daily contact with patient to assess and evaluate symptoms and progress in treatment Medication management  Alejandra Hunt,JANARDHAHA R. 02/08/2014 9:36 AM

## 2014-02-08 NOTE — H&P (Addendum)
[facility] History and Physical  Jacklynn GanongBrandon Carte NWG:[id number]:[id number] DOB: 23-Dec-1990 DOA: 02/08/2014  Referring physician: Dr Norlene Campbellotter PCP: No primary provider on file.   Chief Complaint:  Ingestion of unknown substance/ medications  HPI:  23 year old male with past medical hx of depression and suicidal ideations was brought in by his roommate the patient was incoherent and appeared flushed yesterday afternoon. His friend (Jackson) at bedside who provided the history  informed that patient was last seen on normal around mid-day yesterday. Patient had a missed his brother's graduation and had an argument with his mother yesterday. He also was under a lot of emotional stress after breaking up with his girlfriend a few months back. Patient's friend is unsure as to what he ingested as patient was unattended during the day. He found a bottle of wel-sum ( benadryl) which was opened at the bedside. On counting the pills in the ED patient likely had taken 32 tablets oral 50 mg Benadryl. History difficult to oral pain from the patient as he is restless and incoherent with pressured speech. headache, dizziness, fever, chills, nausea , vomiting, chest pain, palpitations, SOB, abdominal pain, bowel or urinary symptoms. He denies taking any medications or overdosing himself . Denies suicidal ideation.  Course in the ED Patient was tachycardic and diaphoretic  with dilated pupils. His speech was pressured and incoherent. He was afebrile. Blood we'll go in for WBC of 10.5, normal hemoglobin and platelets. Chemistry showed sodium of 140, potassium of 2.2, chloride of 99, CO2 of 22 with anion gap of 19. Renal function was normal. The glucose was 141. Lactic acid was elevated to 2.4. EKG showed normal sinus rhythm with QTC of 484. On arrival the QTC on the monitor was prolonged to greater than 600. Patient given 2 L IV normal saline bolus and 1 mg IV Ativan X2. [facility] called for admission to  stepdown.  Review of Systems:  As outlined in history of present illness    Past Medical History  Diagnosis Date  . Depression    History reviewed. No pertinent past surgical history. Social History:  reports that he has quit smoking. He does not have any smokeless tobacco history on file. He reports that he drinks alcohol. He reports that he uses illicit drugs (Marijuana).  No Known Allergies  History reviewed. No pertinent family history.  Prior to Admission medications   Not on File     Physical Exam:  Filed Vitals:   02/08/14 0430 02/08/14 0456 02/08/14 0500 02/08/14 0530  BP: 136/68 126/80 127/82 134/88  Pulse:  111 101 90  Temp:  98.9 F (37.2 C)  98.3 F (36.8 C)  TempSrc:  Oral  Oral  Resp: 19  25 19   SpO2:  100% 100% 98%    Constitutional: Vital signs reviewed. Young male lying in bed appears restless HEENT: Dilated pupil, flushed face no pallor, no icterus, moist oral mucosa, Cardiovascular: RRR, S1 normal, S2 normal, no MRG Chest: CTAB, no wheezes, rales, or rhonchi Abdominal: Soft. Non-tender, non-distended, bowel sounds are normal,  GU: no CVA tenderness Ext: warm, no edema Neurological: A&O x2, incoherent with pressured speech, no tremors  Labs on Admission:  Basic Metabolic Panel:  Recent Labs Lab 02/08/14 0345  NA 140  K 3.2*  CL 99  CO2 22  GLUCOSE 141*  BUN 10  CREATININE 0.93  CALCIUM 9.7   Liver Function Tests:  Recent Labs Lab 02/08/14 0345  AST 19  ALT 10  ALKPHOS 60  BILITOT 0.5  PROT 8.4*  ALBUMIN 4.7   No results found for this basename: LIPASE, AMYLASE,  in the last 168 hours No results found for this basename: AMMONIA,  in the last 168 hours CBC:  Recent Labs Lab 02/08/14 0345  WBC 10.5  NEUTROABS 8.5*  HGB 15.4  HCT 43.7  MCV 87.9  PLT 262   Cardiac Enzymes: No results found for this basename: CKTOTAL, CKMB, CKMBINDEX, TROPONINI,  in the last 168 hours BNP: No components found with this basename:  POCBNP,  CBG: No results found for this basename: GLUCAP,  in the last 168 hours  Radiological Exams on Admission: No results found.  EKG: NSR with Qtc of  484  Assessment/Plan  Principal Problem: Drug overdose likely with benadryl Admit to stepdown  neuro checks q 2 hour for next 12 hours , then every 4 hours. Has findings of  overdose including incoherence, dilated pupil, tachycardia , diaphoresis, restlessness and flushing. Suicidal precaution/ seizure precaution. IV ativan 1 mg q4hr prn for seizurre prophylaxis Psych consult called. poison control informed. Recommended monitor for side effets including hypotension, stupor, seizure,hyperthermia and other anticholinergic side effects.. No further recommendations given.  Monitor Qtc. pending urine for drug screen. Monitor mg and cpk. Check cxr to r/o aspiration. - given 2 L NS in ED. continue fluids. Add bicarb if any cardiac arrythmia noted on telemetry. Supplement kcl in fluids.   Active Problems:   Depression Denies suicidal ideation. Psych consult pending  Elevated lactic acid  monitor with fluids     Hypokalemia Replenish kcl in IV fluids. Check mg   Prolonged Qtc monitor on tele. Check mg    Diet:NPO  DVT prophylaxis: sq lovenox   Code Status: full code Family Communication:friend at bedside. Patient's mother's contact no. not available at  this time.  [facility]  Deiontae Rabel [facility] Pager [phone]  Total time spent on admission :50 minutes  If 7PM-7AM, please contact night-coverage [email or web address] Password [email or web address] 02/08/2014, 5:34 AM

## 2014-02-08 NOTE — Progress Notes (Signed)
Patient seen and evaluated earlier this AM. Please refer to H and P for details regarding Assessment and Plan.  Psychiatry on board patient still incoherent and unable to answer questions appropriately still.   Will reassess next am.  Penny PiaVEGA, Makari Sanko

## 2014-02-08 NOTE — Progress Notes (Signed)
[id number]/Shy Guallpa, RN, BSN, CCM: Case management. [phone] Chart reviewed and updated.  Next chart review due on [id number]. Needs for discharge at time of review:  none 

## 2014-02-08 NOTE — ED Provider Notes (Signed)
CSN: [id number]     Arrival date & time 02/08/14  [id number] History   First MD Initiated Contact with Patient 02/08/14 0331     Chief Complaint  Patient presents with  . Ingestion    (Consider location/radiation/quality/duration/timing/severity/associated sxs/prior Treatment) HPI Comments: Patient is a 23 year old male with a history of depression who presents to the emergency department for suspected ingestion. Patient was last seen acting normal 02/07/2014 during the afternoon. Patient denies overdosing on any medication; however, on arrival patient was found to be diaphoretic and tachycardic with dilated pupils and slurred speech. Patient believe to have ingested approximately 30 tablets of sleep aid medication of unknown dosage. Patient will only endorse to taking 1 tablet Adderall. He endorses intermittent chest discomfort. No CP, N/V, syncope, numbness. No known seizure activity PTA. Patient denies SI/HI.  The history is provided by the patient. No language interpreter was used.    Past Medical History  Diagnosis Date  . Depression    History reviewed. No pertinent past surgical history. History reviewed. No pertinent family history. History  Substance Use Topics  . Smoking status: Former Games developermoker  . Smokeless tobacco: Not on file  . Alcohol Use: Yes     Comment: occasional use     Review of Systems  Constitutional: Positive for diaphoresis. Negative for fever.  Respiratory: Negative for shortness of breath.   Cardiovascular: Positive for chest pain.  Gastrointestinal: Negative for nausea and vomiting.  Neurological: Negative for seizures and syncope.  Psychiatric/Behavioral: The patient is nervous/anxious.   All other systems reviewed and are negative.    Allergies  Review of patient's allergies indicates no known allergies.  Home Medications   Prior to Admission medications   Not on File   BP 126/80  Pulse 101  Temp(Src) 98.9 F (37.2 C) (Oral)  Resp 25  SpO2  100%  Physical Exam  Nursing note and vitals reviewed. Constitutional: He is oriented to person, place, and time. He appears well-developed and well-nourished. No distress.  HENT:  Head: Normocephalic and atraumatic.  Eyes: Conjunctivae and EOM are normal. Pupils are equal, round, and reactive to light. No scleral icterus.  Pupils 8mm b/l; reactive.  Neck: Normal range of motion.  Cardiovascular: Regular rhythm and normal heart sounds.  Tachycardia present.   Tachy to 130 on arrival  Pulmonary/Chest: Effort normal and breath sounds normal. No respiratory distress. He has no wheezes. He has no rales.  Abdominal: Soft. He exhibits no distension. There is no tenderness. There is no rebound and no guarding.  Soft without masses. Patient denies TTP. No peritoneal signs.  Musculoskeletal: Normal range of motion.  Neurological: He is alert and oriented to person, place, and time. He exhibits normal muscle tone.  GCS 15. Speech is goal oriented; mildly slurred. Patient moves extremities without ataxia. He follows simple commands.  Skin: Skin is warm. No rash noted. He is diaphoretic (mild). No erythema. No pallor.  Psychiatric: His mood appears anxious. His speech is slurred (intermittently). He expresses no suicidal ideation. He expresses no suicidal plans.  Distracted, but directable He is inattentive.    ED Course  Procedures (including critical care time) Labs Review Labs Reviewed  CBC WITH DIFFERENTIAL - Abnormal; Notable for the following:    Neutrophils Relative % 81 (*)    Neutro Abs 8.5 (*)    All other components within normal limits  COMPREHENSIVE METABOLIC PANEL - Abnormal; Notable for the following:    Potassium 3.2 (*)    Glucose, Bld 141 (*)  Total Protein 8.4 (*)    All other components within normal limits  SALICYLATE LEVEL - Abnormal; Notable for the following:    Salicylate Lvl <2.0 (*)    All other components within normal limits  I-STAT CG4 LACTIC ACID, ED -  Abnormal; Notable for the following:    Lactic Acid, Venous 2.34 (*)    All other components within normal limits  ETHANOL  ACETAMINOPHEN LEVEL  URINE RAPID DRUG SCREEN (HOSP PERFORMED)  I-STAT TROPOININ, ED    Imaging Review No results found.   EKG Interpretation   Date/Time:  Monday February 08 2014 03:29:03 EDT Ventricular Rate:  128 PR Interval:  124 QRS Duration: 102 QT Interval:  444 QTC Calculation: 648 R Axis:   101 Text Interpretation:  Sinus tachycardia Borderline right axis deviation ST  elev, probable normal early repol pattern Prolonged QT interval No old  tracing to compare Confirmed by OTTER  MD, OLGA ([id number]) on 02/08/2014  3:32:27 AM      CRITICAL CARE Performed by: Antony MaduraHUMES, Makenli Derstine   Total critical care time: 35  Critical care time was exclusive of separately billable procedures and treating other patients.  Critical care was necessary to treat or prevent imminent or life-threatening deterioration.  Critical care was time spent personally by me on the following activities: development of treatment plan with patient and/or surrogate as well as nursing, discussions with consultants, evaluation of patient's response to treatment, examination of patient, obtaining history from patient or surrogate, ordering and performing treatments and interventions, ordering and review of laboratory studies, ordering and review of radiographic studies, pulse oximetry and re-evaluation of patient's condition.  MDM   Final diagnoses:  Ingestion of unknown medication  Prolonged QT interval    23 year old male with a history of depression presents to the emergency department today after ingesting an unknown substance. Friends believe patient took approximately 30 tablets of sleep at medication. Patient only endorses taking 1 tablet Adderall. He denies SI today.  QTC prolonged on arrival to 600. This has improved to 485 with IV fluids and Ativan. Patient still appears altered. He  will require further monitoring and evaluation as well as psychiatric evaluation when mentating at baseline. [facility] to admit to step down.   Filed Vitals:   02/08/14 0426 02/08/14 0430 02/08/14 0456 02/08/14 0500  BP:  136/68 126/80   Pulse:   111 101  Temp: 98.6 F (37 C)  98.9 F (37.2 C)   TempSrc: Oral  Oral   Resp:  19  25  SpO2:   100% 100%     Antony MaduraKelly Karrisa Didio, PA-C 02/08/14 0530

## 2014-02-08 NOTE — Progress Notes (Signed)
Clinical Social Work Department CLINICAL SOCIAL WORK PSYCHIATRY SERVICE LINE ASSESSMENT 02/08/2014  Patient:  Darius Wilson  Account:  [id number]  [place] Date:  02/08/2014  Clinical Social Worker:  Sindy Messing, LCSW  Date/Time:  02/08/2014 09:15 AM Referred by:  Physician  Date referred:  02/08/2014 Reason for Referral  Psychosocial assessment   Presenting Symptoms/Problems (In the person's/family's own words):   Psych consulted due suspected overdose attempt.   Abuse/Neglect/Trauma History (check all that apply)  Denies history   Abuse/Neglect/Trauma Comments:   Psychiatric History (check all that apply)  Denies history   Psychiatric medications:  None   Current Mental Health Hospitalizations/Previous Mental Health History:   Patient states that he feels sad at times. Patient spoke about recent break up with fiance of 2 years that triggered sadness. Due to current mental status and hallucinations patient is unable to elaborate any further but does deny any outpatient follow up.   Current provider:   None   Place and Date:   N/A   Current Medications:   Scheduled Meds:      . enoxaparin (LOVENOX) injection  40 mg Subcutaneous Q24H  . sodium chloride  3 mL Intravenous Q12H        Continuous Infusions:      . sodium chloride 0.9 % 1,000 mL with potassium chloride 40 mEq infusion 100 mL/hr at 02/08/14 0800          PRN Meds:.acetaminophen, acetaminophen, LORazepam, ondansetron (ZOFRAN) IV, ondansetron       Previous Impatient Admission/Date/Reason:   None reported   Emotional Health / Current Symptoms    Suicide/Self Harm  Suicide attempt in past (date/description)   Suicide attempt in the past:   Patient does admit that he took Adderall pills and that is why he was admitted. Patient reports he does not know how many pills he consumed but does state he "took too many." Per roommate report, open pill bottles were found around patient.   Other harmful behavior:    None reported   Psychotic/Dissociative Symptoms  Visual Hallucinations   Other Psychotic/Dissociative Symptoms:   Patient playing with blanket during assessment and reports he is first looking for blood on blanket and then states that the blanket was a dog that had died.    Attention/Behavioral Symptoms  Inattentive   Other Attention / Behavioral Symptoms:   Patient looks off into the distance and appears to be responding to internal stimuli. Due to hallucinations, patient is unable to fully participate in assessment.    Cognitive Impairment  Orientation - Place  Orientation - Self   Other Cognitive Impairment:    Mood and Adjustment  Lethargic    Stress, Anxiety, Trauma, Any Recent Loss/Stressor  Relationship   Anxiety (frequency):   N/A   Phobia (specify):   N/A   Compulsive behavior (specify):   N/A   Obsessive behavior (specify):   N/A   Other:   Patient reports he was engaged for 2 years and that he and fiance recently broke up.   Substance Abuse/Use  Current substance use   SBIRT completed (please refer for detailed history):  N  Self-reported substance use:   Patient unable to fully discuss SA at this time but did test positive on drug screen for [facility] and Amphetamines.   Urinary Drug Screen Completed:  Y Alcohol level:   <11    Environmental/Housing/Living Arrangement  Stable housing   Who is in the home:   Patient reports he lives with 2-3 roommates.  Emergency contact:  Darius Wilson   Patient's Strengths and Goals (patient's own words):   Patient reports friends brought him to the hospital. Patient has a job.   Clinical Social Worker's Interpretive Summary:   CSW received referral in order to complete psychosocial assessment. CSW reviewed chart and met with patient at bedside. CSW introduced myself and explained role.    Patient reports he lives in [place] and works as a Doctor, general practice. Patient is currently living  with 2-3 friends and reports that they brought him to the hospital. Patient reports that Baran drove Brandy's car to get him to the hospital but is unable to provide any further detail. When CSW first asked patient about why he was admitted patient reports he does not know why and that friends do not feel he needs to be at the hospital either. Later on in the assessment patient does report that he took "too many" Adderall pills but is unable to give an estimate on how many pills he consumed. Per chart review, friends found empty pill bottles around patient. Patient denies any previous MH diagnosis and claims that he does not follow up on outpatient basis with any treatment.    Patient is unable to provide much history and is fixated on blankets on bed. Patient is hallucinating and reports that there is blood on the blanket and then becomes upset stating that the blanket is a dog and it has died. Patient confused at times during the assessment by stating that he has a son that is about to graduate from high school. Patient is only 50 but reports that he had son when he was 62 years old. Patient denies seeing son often and reports mother of son has custody. Patient does appear to be more lucid when talking about recent break up with fiance and struggles with remaining friends after romantic relationship ended.    CSW will continue to follow and will assess more thoroughly when patient is able to fully participate. CSW will attempt to reach family for collateral information.   Disposition:  Recommend Psych CSW continuing to support while in hospital   [place], [place] [phone]

## 2014-02-09 LAB — BASIC METABOLIC PANEL
BUN: 4 mg/dL — AB (ref 6–23)
CHLORIDE: 104 meq/L (ref 96–112)
CO2: 21 mEq/L (ref 19–32)
Calcium: 9.2 mg/dL (ref 8.4–10.5)
Creatinine, Ser: 0.74 mg/dL (ref 0.50–1.35)
GFR calc non Af Amer: >90 mL/min (ref >90)
Glucose, Bld: 84 mg/dL (ref 70–99)
POTASSIUM: 4.7 meq/L (ref 3.7–5.3)
SODIUM: 138 meq/L (ref 137–147)

## 2014-02-09 LAB — MAGNESIUM: Magnesium: 1.9 mg/dL (ref 1.5–2.5)

## 2014-02-09 MED ORDER — POTASSIUM CHLORIDE CRYS ER 20 MEQ PO TBCR
40.0000 meq | EXTENDED_RELEASE_TABLET | Freq: Once | ORAL | Status: AC
  Administered 2014-02-09: 40 meq via ORAL
  Filled 2014-02-09: qty 2

## 2014-02-09 NOTE — Consult Note (Signed)
[facility] Psychiatry Consult   Reason for Consult:  Drug overdose (Benadryl and amphitamine) and cannabis abuse Referring Physician:  Dr. Dhungel/Dr. Elberta Fortis Darius Wilson is an 23 y.o. male. Total Time spent with patient: 45 minutes  Assessment: AXIS I: Generalized Anxiety Disorder, Major Depression, Recurrent severe and Cannabis abuse AXIS II:  Deferred AXIS III:   Past Medical History  Diagnosis Date  . Depression    AXIS IV:  other psychosocial or environmental problems, problems related to social environment and problems with primary support group AXIS V:  21-30 behavior considerably influenced by delusions or hallucinations OR serious impairment in judgment, communication OR inability to function in almost all areas  Plan:  Case discussed with Debarah Crape, [facility] at [facility] and may have bed available soon Cont Seroquel 50 mg BID/PRN for agitation and aggression  Recommend psychiatric Inpatient admission when medically cleared. Supportive therapy provided about ongoing stressors. Discussed crisis plan, support from social network, calling 911, coming to the Emergency Department, and calling Suicide Hotline. Appreciate psychiatric consultation and follow up tomorrow Please contact [phone] if needs further assistance  Subjective:   Darius Wilson is a 23 y.o. male patient admitted with Drug overdose.  HPI:  Patient is seen and chart reviewed, case discussed Sindy Messing, LCSW today for psychiatric consultation follow up. Patient has been calm, quiet and cooperative. He has cleared from his drug overdose and has clear sensorium. He has endorsed suicidal attempt as a result of multiple psychosocial problems like no job, financial difficulties, no transportation, poor support from biological family and had conflict with his mother regarding his brother graduation party, which he was not allowed, so he felt hopeless and helpless and overdosed on sleeping pills and adderall with  intention to kill himself.  anxious, not coherent, disorganized and poor historian at this time. Patient UDS is positive for amphitamines and cannabis and BAL is less than 11.   Medical history: difficult to oral pain from the patient as he is restless and incoherent with pressured speech. headache, dizziness, fever, chills, nausea , vomiting, chest pain, palpitations, SOB, abdominal pain, bowel or urinary symptoms. He denies taking any medications or overdosing himself . Denies suicidal ideation. Course in the ED Patient was tachycardic and diaphoretic with dilated pupils. His speech was pressured and incoherent. He was afebrile. Blood we'll go in for WBC of 10.5, normal hemoglobin and platelets. Chemistry showed sodium of 140, potassium of 2.2, chloride of 99, CO2 of 22 with anion gap of 19. Renal function was normal. The glucose was 141. Lactic acid was elevated to 2.4. EKG showed normal sinus rhythm with QTC of 484. On arrival the QTC on the monitor was prolonged to greater than 600. Patient given 2 L IV normal saline bolus and 1 mg IV Ativan X2.    Review of Systems:  As outlined in history of present illness  HPI Elements:   Location:  depression and suicidal attempt. Quality:  poor. Severity:  acute. Timing:  unknown stress.  Past Psychiatric History: Past Medical History  Diagnosis Date  . Depression     reports that he has been smoking Cigarettes.  He has a 4 pack-year smoking history. He has never used smokeless tobacco. He reports that he drinks alcohol. He reports that he uses illicit drugs (Marijuana). History reviewed. No pertinent family history.   Living Arrangements: Non-relatives/FriendsAbuse/Neglect Darius Wilson) Physical Abuse: Denies Verbal Abuse: Denies Sexual Abuse: Denies Allergies:  No Known Allergies  ACT Assessment Complete:  NO Objective: Blood  pressure 113/63, pulse 76, temperature 97.8 F (36.6 C), temperature source Oral, resp. rate 22, height 5' 10" (1.778 m),  weight 67.5 kg (148 lb 13 oz), SpO2 98.00%.Body mass index is 21.35 kg/(m^2). Results for orders placed during the Wilson encounter of 02/08/14 (from the past 72 hour(s))  CBC WITH DIFFERENTIAL     Status: Abnormal   Collection Time    02/08/14  3:45 AM      Result Value Ref Range   WBC 10.5  4.0 - 10.5 K/uL   RBC 4.97  4.22 - 5.81 MIL/uL   Hemoglobin 15.4  13.0 - 17.0 g/dL   HCT 43.7  39.0 - 52.0 %   MCV 87.9  78.0 - 100.0 fL   MCH 31.0  26.0 - 34.0 pg   MCHC 35.2  30.0 - 36.0 g/dL   RDW 12.7  11.5 - 15.5 %   Platelets 262  150 - 400 K/uL   Neutrophils Relative % 81 (*) 43 - 77 %   Neutro Abs 8.5 (*) 1.7 - 7.7 K/uL   Lymphocytes Relative 13  12 - 46 %   Lymphs Abs 1.4  0.7 - 4.0 K/uL   Monocytes Relative 6  3 - 12 %   Monocytes Absolute 0.6  0.1 - 1.0 K/uL   Eosinophils Relative 0  0 - 5 %   Eosinophils Absolute 0.0  0.0 - 0.7 K/uL   Basophils Relative 0  0 - 1 %   Basophils Absolute 0.0  0.0 - 0.1 K/uL  COMPREHENSIVE METABOLIC PANEL     Status: Abnormal   Collection Time    02/08/14  3:45 AM      Result Value Ref Range   Sodium 140  137 - 147 mEq/L   Potassium 3.2 (*) 3.7 - 5.3 mEq/L   Chloride 99  96 - 112 mEq/L   CO2 22  19 - 32 mEq/L   Glucose, Bld 141 (*) 70 - 99 mg/dL   BUN 10  6 - 23 mg/dL   Creatinine, Ser 0.93  0.50 - 1.35 mg/dL   Calcium 9.7  8.4 - 10.5 mg/dL   Total Protein 8.4 (*) 6.0 - 8.3 g/dL   Albumin 4.7  3.5 - 5.2 g/dL   AST 19  0 - 37 U/L   ALT 10  0 - 53 U/L   Alkaline Phosphatase 60  39 - 117 U/L   Total Bilirubin 0.5  0.3 - 1.2 mg/dL   GFR calc non Af Amer >90  >90 mL/min   GFR calc Af Amer >90  >90 mL/min   Comment: (NOTE)     The eGFR has been calculated using the CKD EPI equation.     This calculation has not been validated in all clinical situations.     eGFR's persistently <90 mL/min signify possible Chronic Kidney     Disease.  ETHANOL     Status: None   Collection Time    02/08/14  3:45 AM      Result Value Ref Range   Alcohol,  Ethyl (B) <11  0 - 11 mg/dL   Comment:            LOWEST DETECTABLE LIMIT FOR     SERUM ALCOHOL IS 11 mg/dL     FOR MEDICAL PURPOSES ONLY  ACETAMINOPHEN LEVEL     Status: None   Collection Time    02/08/14  3:45 AM      Result Value Ref Range   Acetaminophen (  Tylenol), Serum <15.0  10 - 30 ug/mL   Comment:            THERAPEUTIC CONCENTRATIONS VARY     SIGNIFICANTLY. A RANGE OF 10-30     ug/mL MAY BE AN EFFECTIVE     CONCENTRATION FOR MANY PATIENTS.     HOWEVER, SOME ARE BEST TREATED     AT CONCENTRATIONS OUTSIDE THIS     RANGE.     ACETAMINOPHEN CONCENTRATIONS     >150 ug/mL AT 4 HOURS AFTER     INGESTION AND >50 ug/mL AT 12     HOURS AFTER INGESTION ARE     OFTEN ASSOCIATED WITH TOXIC     REACTIONS.  SALICYLATE LEVEL     Status: Abnormal   Collection Time    02/08/14  3:45 AM      Result Value Ref Range   Salicylate Lvl <7.8 (*) 2.8 - 20.0 mg/dL  MAGNESIUM     Status: None   Collection Time    02/08/14  3:45 AM      Result Value Ref Range   Magnesium 2.0  1.5 - 2.5 mg/dL  CK     Status: None   Collection Time    02/08/14  3:45 AM      Result Value Ref Range   Total CK 194  7 - 232 U/L  I-STAT TROPOININ, ED     Status: None   Collection Time    02/08/14  3:56 AM      Result Value Ref Range   Troponin i, poc 0.00  0.00 - 0.08 ng/mL   Comment 3            Comment: Due to the release kinetics of cTnI,     a negative result within the first hours     of the onset of symptoms does not rule out     myocardial infarction with certainty.     If myocardial infarction is still suspected,     repeat the test at appropriate intervals.  I-STAT CG4 LACTIC ACID, ED     Status: Abnormal   Collection Time    02/08/14  3:57 AM      Result Value Ref Range   Lactic Acid, Venous 2.34 (*) 0.5 - 2.2 mmol/L  URINE RAPID DRUG SCREEN (HOSP PERFORMED)     Status: Abnormal   Collection Time    02/08/14  5:11 AM      Result Value Ref Range   Opiates NONE DETECTED  NONE DETECTED    Cocaine NONE DETECTED  NONE DETECTED   Benzodiazepines NONE DETECTED  NONE DETECTED   Amphetamines POSITIVE (*) NONE DETECTED   Tetrahydrocannabinol POSITIVE (*) NONE DETECTED   Barbiturates NONE DETECTED  NONE DETECTED   Comment:            DRUG SCREEN FOR MEDICAL PURPOSES     ONLY.  IF CONFIRMATION IS NEEDED     FOR ANY PURPOSE, NOTIFY LAB     WITHIN 5 DAYS.                LOWEST DETECTABLE LIMITS     FOR URINE DRUG SCREEN     Drug Class       Cutoff (ng/mL)     Amphetamine      1000     Barbiturate      200     Benzodiazepine   469     Tricyclics       629  Opiates          300     Cocaine          300     THC              50  MRSA PCR SCREENING     Status: None   Collection Time    02/08/14  7:04 AM      Result Value Ref Range   MRSA by PCR NEGATIVE  NEGATIVE   Comment:            The GeneXpert MRSA Assay (FDA     approved for NASAL specimens     only), is one component of a     comprehensive MRSA colonization     surveillance program. It is not     intended to diagnose MRSA     infection nor to guide or     monitor treatment for     MRSA infections.  MAGNESIUM     Status: None   Collection Time    02/09/14 10:25 AM      Result Value Ref Range   Magnesium 1.9  1.5 - 2.5 mg/dL  BASIC METABOLIC PANEL     Status: Abnormal   Collection Time    02/09/14 10:25 AM      Result Value Ref Range   Sodium 138  137 - 147 mEq/L   Potassium 4.7  3.7 - 5.3 mEq/L   Comment: DELTA CHECK NOTED     REPEATED TO VERIFY     NO VISIBLE HEMOLYSIS   Chloride 104  96 - 112 mEq/L   CO2 21  19 - 32 mEq/L   Glucose, Bld 84  70 - 99 mg/dL   BUN 4 (*) 6 - 23 mg/dL   Creatinine, Ser 0.74  0.50 - 1.35 mg/dL   Calcium 9.2  8.4 - 10.5 mg/dL   GFR calc non Af Amer >90  >90 mL/min   GFR calc Af Amer >90  >90 mL/min   Comment: (NOTE)     The eGFR has been calculated using the CKD EPI equation.     This calculation has not been validated in all clinical situations.     eGFR's persistently  <90 mL/min signify possible Chronic Kidney     Disease.   Labs are reviewed and are pertinent for amphetamine and THC.  Current Facility-Administered Medications  Medication Dose Route Frequency Provider Last Rate Last Dose  . acetaminophen (TYLENOL) tablet 650 mg  650 mg Oral Q6H PRN Nishant Dhungel, MD       Or  . acetaminophen (TYLENOL) suppository 650 mg  650 mg Rectal Q6H PRN Nishant Dhungel, MD      . enoxaparin (LOVENOX) injection 40 mg  40 mg Subcutaneous Q24H Nishant Dhungel, MD   40 mg at 02/09/14 0800  . LORazepam (ATIVAN) injection 2 mg  2 mg Intravenous Q4H PRN Nishant Dhungel, MD      . ondansetron (ZOFRAN) tablet 4 mg  4 mg Oral Q6H PRN Nishant Dhungel, MD       Or  . ondansetron (ZOFRAN) injection 4 mg  4 mg Intravenous Q6H PRN Nishant Dhungel, MD      . potassium chloride SA (K-DUR,KLOR-CON) CR tablet 40 mEq  40 mEq Oral Once Velvet Bathe, MD      . QUEtiapine (SEROQUEL) tablet 50 mg  50 mg Oral BID PRN Durward Parcel, MD      . sodium chloride 0.9 % injection  3 mL  3 mL Intravenous Q12H Nishant Dhungel, MD        Psychiatric Specialty Exam: Physical Exam  ROS  Blood pressure 113/63, pulse 76, temperature 97.8 F (36.6 C), temperature source Oral, resp. rate 22, height 5' 10" (1.778 m), weight 67.5 kg (148 lb 13 oz), SpO2 98.00%.Body mass index is 21.35 kg/(m^2).  General Appearance: Guarded and long hair  Eye Contact::  Good  Speech:  Clear and Coherent and Normal Rate  Volume:  Decreased  Mood:  Depressed and Hopeless  Affect:  Congruent and Constricted  Thought Process:  Coherent and Goal Directed  Orientation:  Full (Time, Place, and Person)  Thought Content:  WDL and Rumination  Suicidal Thoughts:  Yes.  with intent/plan  Homicidal Thoughts:  No  Memory:  Immediate;   Fair Recent;   Fair  Judgement:  Impaired  Insight:  Lacking  Psychomotor Activity:  Decreased  Concentration:  Good  Recall:  Good  Fund of Knowledge:Good  Language: Good   Akathisia:  NA  Handed:  Right  AIMS (if indicated):     Assets:  Communication Skills Desire for Improvement Financial Resources/Insurance Leisure Time Physical Health Resilience Social Support Talents/Skills  Sleep:      Musculoskeletal: Strength & Muscle Tone: within normal limits and increased Gait & Station: normal, unable to stand Patient leans: N/A  Treatment Plan Summary: Daily contact with patient to assess and evaluate symptoms and progress in treatment Medication management  ,JANARDHAHA R. 02/09/2014 11:45 AM

## 2014-02-09 NOTE — Discharge Summary (Signed)
Physician Discharge Summary  Darius Wilson ZOX:[id number] DOB: 1991/03/16 DOA: 02/08/2014  PCP: No primary provider on file.  Admit date: 02/08/2014 Discharge date: 02/09/2014  Time spent: > 35 minutes  Recommendations for Outpatient Follow-up:  1. Reassess K levels within the next 1 week  Discharge Diagnoses:  Principal Problem:   Drug overdose, intentional Active Problems:   Ingestion of unknown substance   Depression   Hypokalemia   Discharge Condition: stable  Diet recommendation: regular  Filed Weights   02/08/14 0636 02/09/14 0403  Weight: 69.1 kg (152 lb 5.4 oz) 67.5 kg (148 lb 13 oz)    History of present illness:  23 y/o with history of depression and suicidal ideation who presented to the hospital by his room mate after patient was found beside an empty bottle of benadryl.  It is thought patient may have ingested 32 tablets of oral 50 mg of Benadryl.  Hospital Course:  Intentional Overdose - Alert and awake currently. - Condition much improved with IVF's and close monitoring - Plan will be to transition to Behavioral health or psychiatric hospital. Currently patient medically cleared for discharge - Was hallucinating 02/08/14 and psychiatrist was unable to assess patient appropriately due to metabolic encephalopathy. He was wanting to leave AMA but I did not think patient was safe to go home and ultimately requested IVC paper work  Hypokalemia - Will replace orally with K dur 40 meq po x 1 prior to discharge - reassess levels prior to discharging to behavioral health.  Procedures:  None  Consultations: Psychiatry: Nehemiah SettleJanardhaha R Jonnalagadda, MD  Discharge Exam: Filed Vitals:   02/09/14 0800  BP: 113/63  Pulse:   Temp:   Resp: 22    General: Pt in NAD, alert and awake Cardiovascular: RRR, no MRG Respiratory: CTA BL, no wheezes  Discharge Instructions You were cared for by a hospitalist during your hospital stay. If you have any questions about  your discharge medications or the care you received while you were in the hospital after you are discharged, you can call the unit and asked to speak with the hospitalist on call if the hospitalist that took care of you is not available. Once you are discharged, your primary care physician will handle any further medical issues. Please note that NO REFILLS for any discharge medications will be authorized once you are discharged, as it is imperative that you return to your primary care physician (or establish a relationship with a primary care physician if you do not have one) for your aftercare needs so that they can reassess your need for medications and monitor your lab values.  Discharge Instructions   Call MD for:  extreme fatigue    Complete by:  As directed      Call MD for:  temperature >100.4    Complete by:  As directed      Diet - low sodium heart healthy    Complete by:  As directed      Increase activity slowly    Complete by:  As directed             Medication List    Notice   You have not been prescribed any medications.     No Known Allergies    The results of significant diagnostics from this hospitalization (including imaging, microbiology, ancillary and laboratory) are listed below for reference.    Significant Diagnostic Studies: Dg Chest Port 1 View  02/08/2014   CLINICAL DATA:  Overdose.  Possible aspiration.  History of smoking.  EXAM: PORTABLE CHEST - 1 VIEW  COMPARISON:  None.  FINDINGS: The lungs are well-aerated. Minimal right basilar opacity likely reflects atelectasis. There is no definite evidence for aspiration. Pulmonary vascularity is at the upper limits of normal. No pleural effusion or pneumothorax is seen.  The cardiomediastinal silhouette is borderline normal in size. No acute osseous abnormalities are seen.  IMPRESSION: Minimal right basilar airspace opacity likely reflects atelectasis. No definite evidence for aspiration.   Electronically Signed    By: Roanna RaiderJeffery  Chang M.D.   On: 02/08/2014 06:49    Microbiology: Recent Results (from the past 240 hour(s))  MRSA PCR SCREENING     Status: None   Collection Time    02/08/14  7:04 AM      Result Value Ref Range Status   MRSA by PCR NEGATIVE  NEGATIVE Final   Comment:            The GeneXpert MRSA Assay (FDA     approved for NASAL specimens     only), is one component of a     comprehensive MRSA colonization     surveillance program. It is not     intended to diagnose MRSA     infection nor to guide or     monitor treatment for     MRSA infections.     Labs: Basic Metabolic Panel:  Recent Labs Lab 02/08/14 0345  NA 140  K 3.2*  CL 99  CO2 22  GLUCOSE 141*  BUN 10  CREATININE 0.93  CALCIUM 9.7  MG 2.0   Liver Function Tests:  Recent Labs Lab 02/08/14 0345  AST 19  ALT 10  ALKPHOS 60  BILITOT 0.5  PROT 8.4*  ALBUMIN 4.7   No results found for this basename: LIPASE, AMYLASE,  in the last 168 hours No results found for this basename: AMMONIA,  in the last 168 hours CBC:  Recent Labs Lab 02/08/14 0345  WBC 10.5  NEUTROABS 8.5*  HGB 15.4  HCT 43.7  MCV 87.9  PLT 262   Cardiac Enzymes:  Recent Labs Lab 02/08/14 0345  CKTOTAL 194   BNP: BNP (last 3 results) No results found for this basename: PROBNP,  in the last 8760 hours CBG: No results found for this basename: GLUCAP,  in the last 168 hours     Signed:  Penny PiaVEGA, Dyamon Sosinski  [facility] 02/09/2014, 10:08 AM

## 2014-02-09 NOTE — Progress Notes (Addendum)
Clinical Social Work  CSW contacted the following facilities re: placement:   [facility]- [facility] beds. Referral faxed. (Addendum 1635: confirmed referral was received but have filled their beds with patients from their ED. Will keep referral in case beds become available.)  [facility] but unsure if bed will be available. AC will contact CSW if bed becomes available. (Addendum 1545: AC reports no available beds but will keep patient on waiting list)  [facility]- available beds. Referral faxed. (Addendum 1635: confirmed referral received but not accepting patients at this time due to high acuity.)  [facility]- no available beds  [facility]- possible DC this afternoon. Referral faxed. (Addendum 1635: confirmed referral was received but reports it has not been reviewed yet.)  [facility]- [facility] beds  [facility]- no available beds  [facility]- unsure if any beds [facility] list but agreeable to review. Referral faxed. (Addendum 1635: reports referral was lost. CSW re-faxed information)  [facility]- no available beds  Unk LightningHolly Gerber, [place] [phone]

## 2014-02-09 NOTE — Progress Notes (Signed)
Clinical Social Work Progress Note PSYCHIATRY SERVICE LINE 02/09/2014  Patient:  Darius Wilson  Account:  [id number]  [place] Date:  02/08/2014  Clinical Social Worker:  Sindy Messing, LCSW  Date/Time:  02/09/2014 11:00 AM  Review of Patient  Overall Medical Condition:   Per MD, patient is medically stable to DC to psych facility.   Participation Level:  Active  Participation Quality  Appropriate   Other Participation Quality:   Patient engaged during assessment with appropriate affect.   Affect  Appropriate   Cognitive  Appropriate   Reaction to Medications/Concerns:   None reported   Modes of Intervention  Support   Summary of Progress/Plan at Discharge   CSW met with patient at bedside. Patient had several friends at bedside so CSW asked friends to step out to ensure patient's privacy.    Patient alert and oriented today. Patient apologetic for behavior yesterday and reports he does not remember many facts. CSW explained that when patient was trying to leave AMA that he was placed under IVC. Patient reports understanding and states he was "not in a good mind set."    Patient reports he and mom got into an argument on day of admission. 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 Patient and parents have strained relationship but patient reports good relationship with brothers and friends. Mom told patient he was not allowed to come to brother's graduation and patient reports he "lost it." Patient admits to drinking on the day of admission and reports that overdose was impulsive. Patient regrets decision but reports he did not know how to handle emotions. Patient denies any previous MH diagnosis or treatment. Patient denies any previous attempts.    Patient admits to drinking alcohol on weekends with friends but reports that he usually does not drink during the week. Patient admits to smoking marijuana throughout the week but reports  it is because he enjoys the affects of THC.    Patient aware of IVC status and psych hospital recommendations. Patient agreeable that he needs better coping skills and to focus on his emotional wellbeing. Patient reports he would be interested in further treatment to determine if he is depressed and if medication might be helpful.    CSW will look for placement.      [place], [place] [phone]

## 2014-02-10 DIAGNOSIS — F411 Generalized anxiety disorder: Secondary | ICD-10-CM

## 2014-02-10 DIAGNOSIS — T450X4A Poisoning by antiallergic and antiemetic drugs, undetermined, initial encounter: Secondary | ICD-10-CM

## 2014-02-10 DIAGNOSIS — T50992A Poisoning by other drugs, medicaments and biological substances, intentional self-harm, initial encounter: Secondary | ICD-10-CM

## 2014-02-10 DIAGNOSIS — T43624A Poisoning by amphetamines, undetermined, initial encounter: Secondary | ICD-10-CM

## 2014-02-10 DIAGNOSIS — T43502A Poisoning by unspecified antipsychotics and neuroleptics, intentional self-harm, initial encounter: Secondary | ICD-10-CM

## 2014-02-10 DIAGNOSIS — F332 Major depressive disorder, recurrent severe without psychotic features: Secondary | ICD-10-CM

## 2014-02-10 DIAGNOSIS — F1210 Cannabis abuse, uncomplicated: Secondary | ICD-10-CM

## 2014-02-10 DIAGNOSIS — T438X2A Poisoning by other psychotropic drugs, intentional self-harm, initial encounter: Secondary | ICD-10-CM

## 2014-02-10 LAB — URINALYSIS, ROUTINE W REFLEX MICROSCOPIC
Glucose, UA: NEGATIVE mg/dL
Hgb urine dipstick: NEGATIVE
KETONES UR: 40 mg/dL — AB
NITRITE: NEGATIVE
PH: 6 (ref 5.0–8.0)
PROTEIN: NEGATIVE mg/dL
Specific Gravity, Urine: 1.025 (ref 1.005–1.030)
Urobilinogen, UA: 1 mg/dL (ref 0.0–1.0)

## 2014-02-10 LAB — URINE MICROSCOPIC-ADD ON

## 2014-02-10 NOTE — Consult Note (Signed)
[facility] Psychiatry Consult   Reason for Consult:  Drug overdose (Benadryl and amphitamine) and cannabis abuse Referring Physician:  Dr. Dhungel/Dr. Elberta Fortis Darius Wilson is an 23 y.o. male. Total Time spent with patient: 45 minutes  Assessment: AXIS I: Generalized Anxiety Disorder, Major Depression, Recurrent severe and Cannabis abuse AXIS II:  Deferred AXIS III:   Past Medical History  Diagnosis Date  . Depression    AXIS IV:  other psychosocial or environmental problems, problems related to social environment and problems with primary support group AXIS V:  21-30 behavior considerably influenced by delusions or hallucinations OR serious impairment in judgment, communication OR inability to function in almost all areas  Plan:  Case discussed with Debarah Crape, [facility] at [facility] and may have bed available soon Cont Seroquel 50 mg BID/PRN for agitation and aggression  Recommend psychiatric Inpatient admission when medically cleared. Supportive therapy provided about ongoing stressors. Discussed crisis plan, support from social network, calling 911, coming to the Emergency Department, and calling Suicide Hotline. Appreciate psychiatric consultation and follow up tomorrow Please contact [phone] if needs further assistance  Subjective:   Darius Wilson is a 23 y.o. male patient admitted with Drug overdose.  HPI:  Patient is seen and chart reviewed, case discussed Sindy Messing, LCSW today for psychiatric consultation follow up. Patient has been calm, quiet and cooperative. He has cleared from his drug overdose and has clear sensorium. He has endorsed suicidal attempt as a result of multiple psychosocial problems like no job, financial difficulties, no transportation, poor support from biological family and had conflict with his mother regarding his brother graduation party, which he was not allowed, so he felt hopeless and helpless and overdosed on sleeping pills and adderall with  intention to kill himself.  anxious, not coherent, disorganized and poor historian at this time. Patient UDS is positive for amphitamines and cannabis and BAL is less than 11.   Interval history: patient continue to endorse symptoms of depression, anxiety and multiple psychosocial stresses and conflict with his mother, step father. Patient is more upset and depressed about his mother did not come to see him and communicated until now. Reportedly his brother visited him and he knows that his mother is aware of him being hospitalized. He is awake, alert, oriented well, calm and cooperative. He has been compliant with medications and staff. He is willing to sign into psych hospital and waiting for bed available and LCSW has been working with different facilities including [facility].   Medical history: difficult to oral pain from the patient as he is restless and incoherent with pressured speech. headache, dizziness, fever, chills, nausea , vomiting, chest pain, palpitations, SOB, abdominal pain, bowel or urinary symptoms. He denies taking any medications or overdosing himself . Denies suicidal ideation. Course in the ED Patient was tachycardic and diaphoretic with dilated pupils. His speech was pressured and incoherent. He was afebrile. Blood we'll go in for WBC of 10.5, normal hemoglobin and platelets. Chemistry showed sodium of 140, potassium of 2.2, chloride of 99, CO2 of 22 with anion gap of 19. Renal function was normal. The glucose was 141. Lactic acid was elevated to 2.4. EKG showed normal sinus rhythm with QTC of 484. On arrival the QTC on the monitor was prolonged to greater than 600. Patient given 2 L IV normal saline bolus and 1 mg IV Ativan X2.    Review of Systems:  As outlined in history of present illness  HPI Elements:   Location:  depression  and suicidal attempt. Quality:  poor. Severity:  acute. Timing:  unknown stress.  Past Psychiatric History: Past Medical History  Diagnosis Date  .  Depression     reports that he has been smoking Cigarettes.  He has a 4 pack-year smoking history. He has never used smokeless tobacco. He reports that he drinks alcohol. He reports that he uses illicit drugs (Marijuana). History reviewed. No pertinent family history.   Living Arrangements: Non-relatives/FriendsAbuse/Neglect [facility]) Physical Abuse: Denies Verbal Abuse: Denies Sexual Abuse: Denies Allergies:  No Known Allergies  ACT Assessment Complete:  NO Objective: Blood pressure 112/74, pulse 65, temperature 98.2 F (36.8 C), temperature source Oral, resp. rate 18, height 5' 10"  (1.778 m), weight 67.5 kg (148 lb 13 oz), SpO2 99.00%.Body mass index is 21.35 kg/(m^2). Results for orders placed during the hospital encounter of 02/08/14 (from the past 72 hour(s))  CBC WITH DIFFERENTIAL     Status: Abnormal   Collection Time    02/08/14  3:45 AM      Result Value Ref Range   WBC 10.5  4.0 - 10.5 K/uL   RBC 4.97  4.22 - 5.81 MIL/uL   Hemoglobin 15.4  13.0 - 17.0 g/dL   HCT 43.7  39.0 - 52.0 %   MCV 87.9  78.0 - 100.0 fL   MCH 31.0  26.0 - 34.0 pg   MCHC 35.2  30.0 - 36.0 g/dL   RDW 12.7  11.5 - 15.5 %   Platelets 262  150 - 400 K/uL   Neutrophils Relative % 81 (*) 43 - 77 %   Neutro Abs 8.5 (*) 1.7 - 7.7 K/uL   Lymphocytes Relative 13  12 - 46 %   Lymphs Abs 1.4  0.7 - 4.0 K/uL   Monocytes Relative 6  3 - 12 %   Monocytes Absolute 0.6  0.1 - 1.0 K/uL   Eosinophils Relative 0  0 - 5 %   Eosinophils Absolute 0.0  0.0 - 0.7 K/uL   Basophils Relative 0  0 - 1 %   Basophils Absolute 0.0  0.0 - 0.1 K/uL  COMPREHENSIVE METABOLIC PANEL     Status: Abnormal   Collection Time    02/08/14  3:45 AM      Result Value Ref Range   Sodium 140  137 - 147 mEq/L   Potassium 3.2 (*) 3.7 - 5.3 mEq/L   Chloride 99  96 - 112 mEq/L   CO2 22  19 - 32 mEq/L   Glucose, Bld 141 (*) 70 - 99 mg/dL   BUN 10  6 - 23 mg/dL   Creatinine, Ser 0.93  0.50 - 1.35 mg/dL   Calcium 9.7  8.4 - 10.5 mg/dL   Total  Protein 8.4 (*) 6.0 - 8.3 g/dL   Albumin 4.7  3.5 - 5.2 g/dL   AST 19  0 - 37 U/L   ALT 10  0 - 53 U/L   Alkaline Phosphatase 60  39 - 117 U/L   Total Bilirubin 0.5  0.3 - 1.2 mg/dL   GFR calc non Af Amer >90  >90 mL/min   GFR calc Af Amer >90  >90 mL/min   Comment: (NOTE)     The eGFR has been calculated using the CKD EPI equation.     This calculation has not been validated in all clinical situations.     eGFR's persistently <90 mL/min signify possible Chronic Kidney     Disease.  ETHANOL     Status:  None   Collection Time    02/08/14  3:45 AM      Result Value Ref Range   Alcohol, Ethyl (B) <11  0 - 11 mg/dL   Comment:            LOWEST DETECTABLE LIMIT FOR     SERUM ALCOHOL IS 11 mg/dL     FOR MEDICAL PURPOSES ONLY  ACETAMINOPHEN LEVEL     Status: None   Collection Time    02/08/14  3:45 AM      Result Value Ref Range   Acetaminophen (Tylenol), Serum <15.0  10 - 30 ug/mL   Comment:            THERAPEUTIC CONCENTRATIONS VARY     SIGNIFICANTLY. A RANGE OF 10-30     ug/mL MAY BE AN EFFECTIVE     CONCENTRATION FOR MANY PATIENTS.     HOWEVER, SOME ARE BEST TREATED     AT CONCENTRATIONS OUTSIDE THIS     RANGE.     ACETAMINOPHEN CONCENTRATIONS     >150 ug/mL AT 4 HOURS AFTER     INGESTION AND >50 ug/mL AT 12     HOURS AFTER INGESTION ARE     OFTEN ASSOCIATED WITH TOXIC     REACTIONS.  SALICYLATE LEVEL     Status: Abnormal   Collection Time    02/08/14  3:45 AM      Result Value Ref Range   Salicylate Lvl <7.6 (*) 2.8 - 20.0 mg/dL  MAGNESIUM     Status: None   Collection Time    02/08/14  3:45 AM      Result Value Ref Range   Magnesium 2.0  1.5 - 2.5 mg/dL  CK     Status: None   Collection Time    02/08/14  3:45 AM      Result Value Ref Range   Total CK 194  7 - 232 U/L  I-STAT TROPOININ, ED     Status: None   Collection Time    02/08/14  3:56 AM      Result Value Ref Range   Troponin i, poc 0.00  0.00 - 0.08 ng/mL   Comment 3            Comment: Due to the  release kinetics of cTnI,     a negative result within the first hours     of the onset of symptoms does not rule out     myocardial infarction with certainty.     If myocardial infarction is still suspected,     repeat the test at appropriate intervals.  I-STAT CG4 LACTIC ACID, ED     Status: Abnormal   Collection Time    02/08/14  3:57 AM      Result Value Ref Range   Lactic Acid, Venous 2.34 (*) 0.5 - 2.2 mmol/L  URINE RAPID DRUG SCREEN (HOSP PERFORMED)     Status: Abnormal   Collection Time    02/08/14  5:11 AM      Result Value Ref Range   Opiates NONE DETECTED  NONE DETECTED   Cocaine NONE DETECTED  NONE DETECTED   Benzodiazepines NONE DETECTED  NONE DETECTED   Amphetamines POSITIVE (*) NONE DETECTED   Tetrahydrocannabinol POSITIVE (*) NONE DETECTED   Barbiturates NONE DETECTED  NONE DETECTED   Comment:            DRUG SCREEN FOR MEDICAL PURPOSES     ONLY.  IF CONFIRMATION IS  NEEDED     FOR ANY PURPOSE, NOTIFY LAB     WITHIN 5 DAYS.                LOWEST DETECTABLE LIMITS     FOR URINE DRUG SCREEN     Drug Class       Cutoff (ng/mL)     Amphetamine      1000     Barbiturate      200     Benzodiazepine   381     Tricyclics       829     Opiates          300     Cocaine          300     THC              50  MRSA PCR SCREENING     Status: None   Collection Time    02/08/14  7:04 AM      Result Value Ref Range   MRSA by PCR NEGATIVE  NEGATIVE   Comment:            The GeneXpert MRSA Assay (FDA     approved for NASAL specimens     only), is one component of a     comprehensive MRSA colonization     surveillance program. It is not     intended to diagnose MRSA     infection nor to guide or     monitor treatment for     MRSA infections.  MAGNESIUM     Status: None   Collection Time    02/09/14 10:25 AM      Result Value Ref Range   Magnesium 1.9  1.5 - 2.5 mg/dL  BASIC METABOLIC PANEL     Status: Abnormal   Collection Time    02/09/14 10:25 AM      Result  Value Ref Range   Sodium 138  137 - 147 mEq/L   Potassium 4.7  3.7 - 5.3 mEq/L   Comment: DELTA CHECK NOTED     REPEATED TO VERIFY     NO VISIBLE HEMOLYSIS   Chloride 104  96 - 112 mEq/L   CO2 21  19 - 32 mEq/L   Glucose, Bld 84  70 - 99 mg/dL   BUN 4 (*) 6 - 23 mg/dL   Creatinine, Ser 0.74  0.50 - 1.35 mg/dL   Calcium 9.2  8.4 - 10.5 mg/dL   GFR calc non Af Amer >90  >90 mL/min   GFR calc Af Amer >90  >90 mL/min   Comment: (NOTE)     The eGFR has been calculated using the CKD EPI equation.     This calculation has not been validated in all clinical situations.     eGFR's persistently <90 mL/min signify possible Chronic Kidney     Disease.   Labs are reviewed and are pertinent for amphetamine and THC.  Current Facility-Administered Medications  Medication Dose Route Frequency Provider Last Rate Last Dose  . acetaminophen (TYLENOL) tablet 650 mg  650 mg Oral Q6H PRN Nishant Dhungel, MD       Or  . acetaminophen (TYLENOL) suppository 650 mg  650 mg Rectal Q6H PRN Nishant Dhungel, MD      . enoxaparin (LOVENOX) injection 40 mg  40 mg Subcutaneous Q24H Nishant Dhungel, MD   40 mg at 02/10/14 1018  . LORazepam (ATIVAN) injection 2 mg  2 mg  Intravenous Q4H PRN Nishant Dhungel, MD      . ondansetron (ZOFRAN) tablet 4 mg  4 mg Oral Q6H PRN Nishant Dhungel, MD       Or  . ondansetron (ZOFRAN) injection 4 mg  4 mg Intravenous Q6H PRN Nishant Dhungel, MD      . QUEtiapine (SEROQUEL) tablet 50 mg  50 mg Oral BID PRN Durward Parcel, MD      . sodium chloride 0.9 % injection 3 mL  3 mL Intravenous Q12H Nishant Dhungel, MD   3 mL at 02/10/14 1019    Psychiatric Specialty Exam: Physical Exam  ROS  Blood pressure 112/74, pulse 65, temperature 98.2 F (36.8 C), temperature source Oral, resp. rate 18, height 5' 10"  (1.778 m), weight 67.5 kg (148 lb 13 oz), SpO2 99.00%.Body mass index is 21.35 kg/(m^2).  General Appearance: Guarded and long hair  Eye Contact::  Good  Speech:  Clear  and Coherent and Normal Rate  Volume:  Decreased  Mood:  Depressed and Hopeless  Affect:  Congruent and Constricted  Thought Process:  Coherent and Goal Directed  Orientation:  Full (Time, Place, and Person)  Thought Content:  WDL and Rumination  Suicidal Thoughts:  Yes.  with intent/plan  Homicidal Thoughts:  No  Memory:  Immediate;   Fair Recent;   Fair  Judgement:  Impaired  Insight:  Lacking  Psychomotor Activity:  Decreased  Concentration:  Good  Recall:  Good  Fund of Knowledge:Good  Language: Good  Akathisia:  NA  Handed:  Right  AIMS (if indicated):     Assets:  Communication Skills Desire for Improvement Financial Resources/Insurance Leisure Time Physical Health Resilience Social Support Talents/Skills  Sleep:      Musculoskeletal: Strength & Muscle Tone: within normal limits and increased Gait & Station: normal, unable to stand Patient leans: N/A  Treatment Plan Summary: Daily contact with patient to assess and evaluate symptoms and progress in treatment Medication management  JONNALAGADDA,JANARDHAHA R. 02/10/2014 10:29 AM

## 2014-02-10 NOTE — Progress Notes (Signed)
Clinical Social Work  Patient accepted to [facility] in [place], [place] and can transfer once UA is completed. Dr. Orene Desanctis is accepting MD and RN to call report to [phone]. ED CSW will fax UA and arrange transport once UA completed. CSW made MD and RN aware of DC plans.  CSW met with patient at bedside and asked visitors to step out for privacy. CSW explained DC plans and that patient was accepted to [facility] provided hospital information and explained DC process. Patient understanding and agreeable to plans.  [place], [place] [phone]

## 2014-02-10 NOTE — Progress Notes (Signed)
Spoke with patient and his girlfriend regarding suicide precautions and the policy corresponding with SP.   Only 2 visitors at a time and only the patient should be in the bed.

## 2014-02-10 NOTE — Progress Notes (Signed)
Clinical Social Work  [facility] if UA can be ordered and faxed. CSW alerted MD and will follow up one UA is received by hospital.   CSW will continue to follow.  [place] Crossroads[place]olly [place], [place] [phone]

## 2014-02-10 NOTE — Progress Notes (Signed)
Clinical Social Work  CSW continues to search for placement for psychiatric placement. CSW contacted the following facilities re: placement:  [facility]- available beds and asked CSW to re-fax referral. Hospital will update CSW if they are able to accept.  [facility]) who reports that she will call if any beds become available.  [facility]- admissions report they have not reviewed referral but will call CSW once they have.  [facility]- no available beds  [facility] but no available beds.  [facility]- left a message with admissions re: bed status  [facility]- no available beds  [facility] is reviewing information to determine [facility].  [facility]- available beds. Referral faxed.  CSW will continue to follow.  [place], [place] [phone]

## 2014-02-10 NOTE — Progress Notes (Signed)
Patient briefly seen. Database reviewed. Has been accepted at psych facility pending UA results (?). Plan to transport there today. UA ordered STAT. No changes to DC Summary from 6/16 by Dr. Cena BentonVega.  Peggye PittEstela Hernandez, MD [facility] Pager: [phone]

## 2014-02-10 NOTE — Progress Notes (Signed)
CS[facility] and called speaking to GarfieldJessica to confirm she received it.  Shanda BumpsJessica reports that he fax machine is not working properly and just to include the UA results in the documentation coming with the patient.  CSW called for transportation with the sheriff department at [phone] leaving a message at 6:12pm for pick up and drop destination.  CSW provided last name, room number, and nurse contact number([phone]).       Maryelizabeth Rowanressa Corbett, MSW, [place]CSWA, 02/10/2014 Evening Clinical Social Worker [phone]

## 2014-02-10 NOTE — Progress Notes (Signed)
Sergeant Paschel called and informed this nurse that patient would not be able to be transported until the morning.  [facility] stated that [facility] would not hold bed for patient, but then agreed to hold bed if patient left first thing in the morning.  Transportation has been set up for 730 AM.  Philomena Dohenyavid Youa Deloney RN

## 2014-02-11 NOTE — Progress Notes (Signed)
Patient picked up by law enforcement.  Patient's belongings returned to law enforcement, and patient dressed in paper scrubs.  [facility] given report.  Philomena Dohenyavid Block RN

## 2014-12-28 IMAGING — CR DG CHEST 1V PORT
1 series · 1 of 1 positions shown · non-contrast
Comparison: None.

CLINICAL DATA: Overdose.  Possible aspiration.  History of smoking.

EXAM:
PORTABLE CHEST - 1 VIEW

[AP]
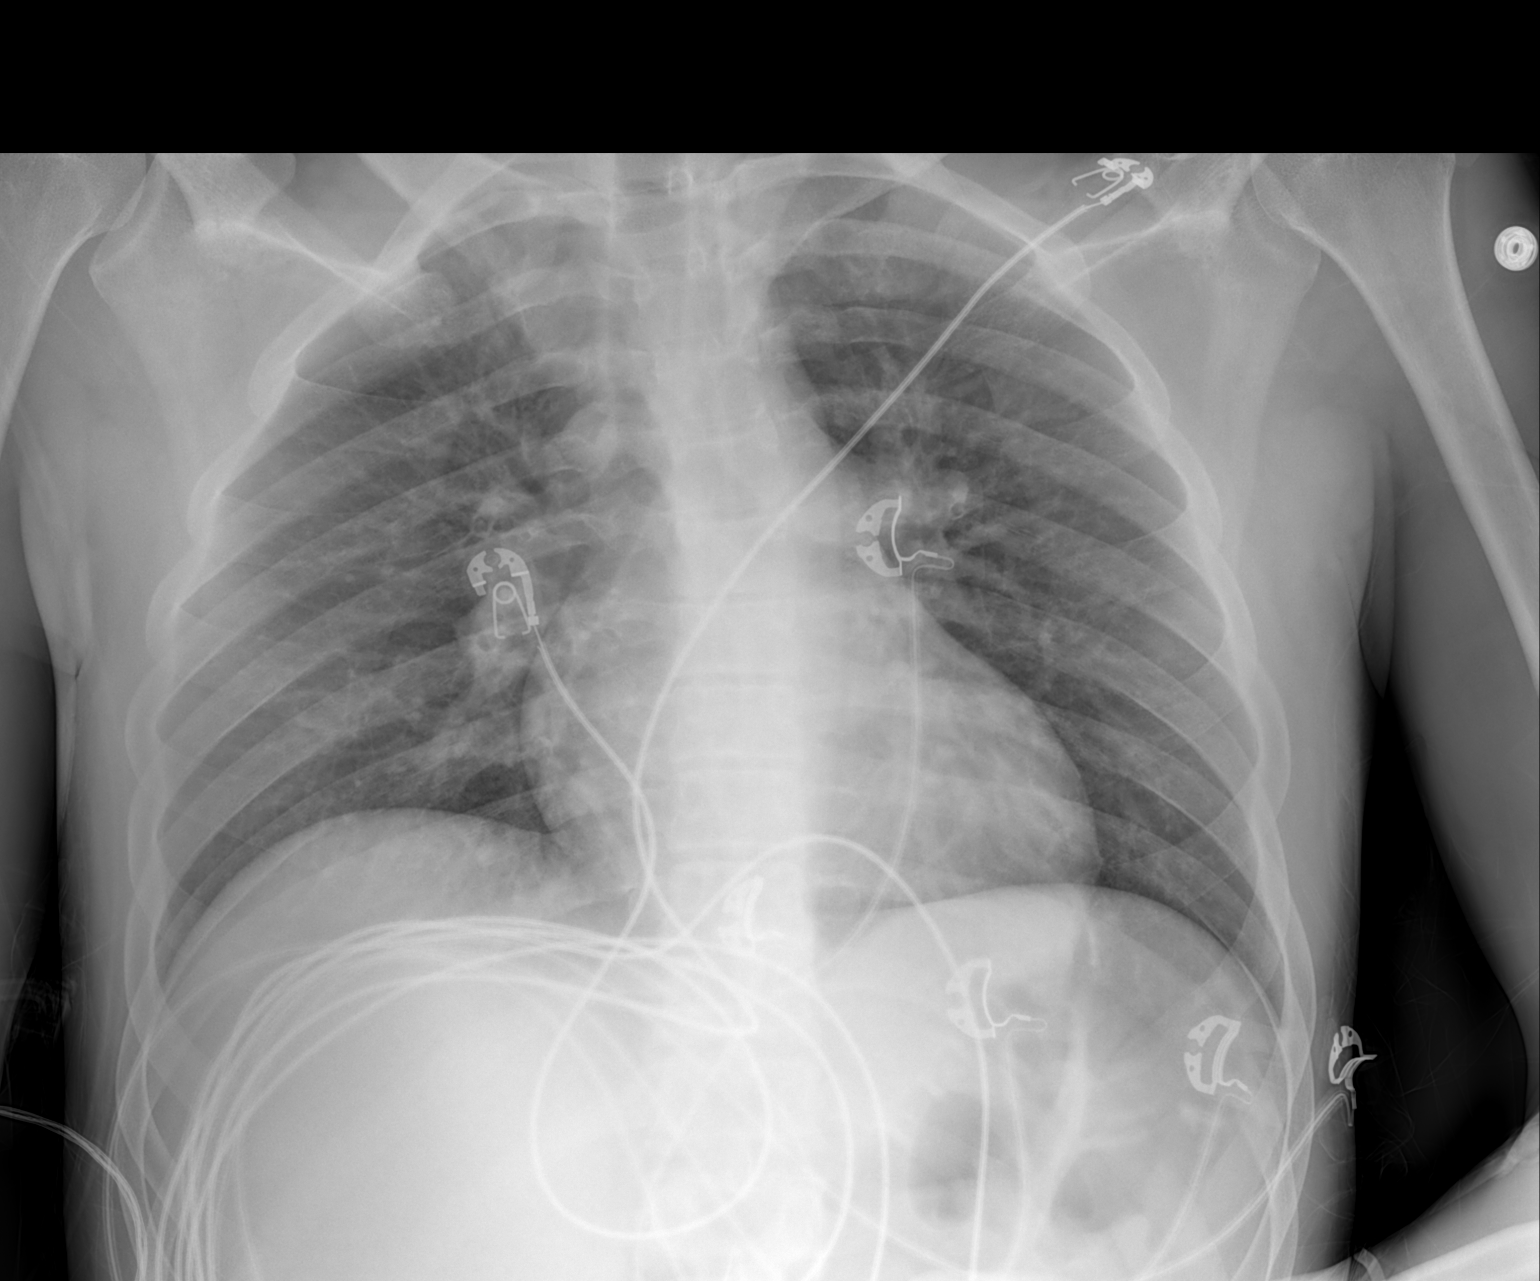

[1 of 1 positions shown; findings below may reference images not displayed]

FINDINGS: The lungs are well-aerated. Minimal right basilar opacity likely
reflects atelectasis. There is no definite evidence for aspiration.
Pulmonary vascularity is at the upper limits of normal. No pleural
effusion or pneumothorax is seen.

The cardiomediastinal silhouette is borderline normal in size. No
acute osseous abnormalities are seen.
IMPRESSION: Minimal right basilar airspace opacity likely reflects atelectasis.
No definite evidence for aspiration.
